# Patient Record
Sex: Female | Born: 1960 | Race: White | Hispanic: No | Marital: Married | Smoking: Never smoker
Health system: Southern US, Community
[De-identification: ages and names within clinical notes are randomized; demographics above are authoritative.]

## PROBLEM LIST (undated history)

## (undated) DIAGNOSIS — E785 Hyperlipidemia, unspecified: Secondary | ICD-10-CM

## (undated) DIAGNOSIS — Z9889 Other specified postprocedural states: Secondary | ICD-10-CM

## (undated) DIAGNOSIS — R112 Nausea with vomiting, unspecified: Secondary | ICD-10-CM

## (undated) HISTORY — PX: ABDOMINAL HYSTERECTOMY: SHX81

## (undated) HISTORY — DX: Hyperlipidemia, unspecified: E78.5

---

## 1998-08-16 ENCOUNTER — Other Ambulatory Visit: Admission: RE | Admit: 1998-08-16 | Discharge: 1998-08-16 | Payer: Self-pay | Admitting: Obstetrics & Gynecology

## 1999-10-10 ENCOUNTER — Encounter: Payer: Self-pay | Admitting: Obstetrics & Gynecology

## 1999-10-10 ENCOUNTER — Encounter: Admission: RE | Admit: 1999-10-10 | Discharge: 1999-10-10 | Payer: Self-pay | Admitting: Obstetrics & Gynecology

## 2000-06-30 ENCOUNTER — Other Ambulatory Visit: Admission: RE | Admit: 2000-06-30 | Discharge: 2000-06-30 | Payer: Self-pay | Admitting: Obstetrics & Gynecology

## 2000-07-13 ENCOUNTER — Ambulatory Visit (HOSPITAL_COMMUNITY): Admission: RE | Admit: 2000-07-13 | Discharge: 2000-07-13 | Payer: Self-pay | Admitting: Emergency Medicine

## 2001-12-21 ENCOUNTER — Other Ambulatory Visit: Admission: RE | Admit: 2001-12-21 | Discharge: 2001-12-21 | Payer: Self-pay | Admitting: Obstetrics & Gynecology

## 2002-03-15 ENCOUNTER — Other Ambulatory Visit: Admission: RE | Admit: 2002-03-15 | Discharge: 2002-03-15 | Payer: Self-pay | Admitting: Obstetrics & Gynecology

## 2002-09-22 ENCOUNTER — Other Ambulatory Visit: Admission: RE | Admit: 2002-09-22 | Discharge: 2002-09-22 | Payer: Self-pay | Admitting: Obstetrics & Gynecology

## 2003-07-11 ENCOUNTER — Other Ambulatory Visit: Admission: RE | Admit: 2003-07-11 | Discharge: 2003-07-11 | Payer: Self-pay | Admitting: Obstetrics and Gynecology

## 2003-07-21 ENCOUNTER — Ambulatory Visit (HOSPITAL_COMMUNITY): Admission: RE | Admit: 2003-07-21 | Discharge: 2003-07-21 | Payer: Self-pay | Admitting: Unknown Physician Specialty

## 2004-03-13 ENCOUNTER — Other Ambulatory Visit: Admission: RE | Admit: 2004-03-13 | Discharge: 2004-03-13 | Payer: Self-pay | Admitting: Obstetrics and Gynecology

## 2004-03-13 ENCOUNTER — Other Ambulatory Visit: Admission: RE | Admit: 2004-03-13 | Discharge: 2004-03-13 | Payer: Self-pay | Admitting: Specialist

## 2004-08-26 ENCOUNTER — Other Ambulatory Visit: Admission: RE | Admit: 2004-08-26 | Discharge: 2004-08-26 | Payer: Self-pay | Admitting: Obstetrics & Gynecology

## 2004-09-04 ENCOUNTER — Encounter: Admission: RE | Admit: 2004-09-04 | Discharge: 2004-09-04 | Payer: Self-pay | Admitting: Obstetrics & Gynecology

## 2005-12-02 ENCOUNTER — Encounter: Admission: RE | Admit: 2005-12-02 | Discharge: 2005-12-02 | Payer: Self-pay | Admitting: Obstetrics & Gynecology

## 2007-09-23 ENCOUNTER — Encounter: Admission: RE | Admit: 2007-09-23 | Discharge: 2007-09-23 | Payer: Self-pay | Admitting: Obstetrics & Gynecology

## 2008-10-06 ENCOUNTER — Ambulatory Visit (HOSPITAL_BASED_OUTPATIENT_CLINIC_OR_DEPARTMENT_OTHER): Admission: RE | Admit: 2008-10-06 | Discharge: 2008-10-06 | Payer: Self-pay | Admitting: Obstetrics & Gynecology

## 2008-10-06 ENCOUNTER — Ambulatory Visit: Payer: Self-pay | Admitting: Diagnostic Radiology

## 2008-12-05 ENCOUNTER — Ambulatory Visit (HOSPITAL_BASED_OUTPATIENT_CLINIC_OR_DEPARTMENT_OTHER): Admission: RE | Admit: 2008-12-05 | Discharge: 2008-12-05 | Payer: Self-pay | Admitting: General Surgery

## 2009-03-29 ENCOUNTER — Ambulatory Visit (HOSPITAL_BASED_OUTPATIENT_CLINIC_OR_DEPARTMENT_OTHER): Admission: RE | Admit: 2009-03-29 | Discharge: 2009-03-29 | Payer: Self-pay | Admitting: Family Medicine

## 2009-03-29 ENCOUNTER — Ambulatory Visit: Payer: Self-pay | Admitting: Diagnostic Radiology

## 2009-09-24 ENCOUNTER — Encounter (INDEPENDENT_AMBULATORY_CARE_PROVIDER_SITE_OTHER): Payer: Self-pay | Admitting: Obstetrics & Gynecology

## 2009-09-24 ENCOUNTER — Ambulatory Visit (HOSPITAL_COMMUNITY): Admission: RE | Admit: 2009-09-24 | Discharge: 2009-09-25 | Payer: Self-pay | Admitting: Obstetrics & Gynecology

## 2010-05-18 LAB — CBC
HCT: 40 % (ref 36.0–46.0)
MCH: 31.6 pg (ref 26.0–34.0)
MCHC: 34.3 g/dL (ref 30.0–36.0)
MCV: 92.3 fL (ref 78.0–100.0)
Platelets: 244 10*3/uL (ref 150–400)
Platelets: 279 10*3/uL (ref 150–400)
RBC: 4.33 MIL/uL (ref 3.87–5.11)
RDW: 12.9 % (ref 11.5–15.5)
RDW: 12.9 % (ref 11.5–15.5)
WBC: 7.4 10*3/uL (ref 4.0–10.5)

## 2010-05-18 LAB — SURGICAL PCR SCREEN: Staphylococcus aureus: NEGATIVE

## 2010-06-04 ENCOUNTER — Inpatient Hospital Stay (HOSPITAL_COMMUNITY)
Admission: AD | Admit: 2010-06-04 | Discharge: 2010-06-06 | DRG: 384 | Disposition: A | Discharge status: Home / Self Care | Payer: 59 | Source: Ambulatory Visit | Attending: Cardiovascular Disease | Admitting: Cardiovascular Disease

## 2010-06-04 DIAGNOSIS — K259 Gastric ulcer, unspecified as acute or chronic, without hemorrhage or perforation: Principal | ICD-10-CM | POA: Diagnosis present

## 2010-06-04 DIAGNOSIS — K5900 Constipation, unspecified: Secondary | ICD-10-CM | POA: Diagnosis present

## 2010-06-04 DIAGNOSIS — E559 Vitamin D deficiency, unspecified: Secondary | ICD-10-CM | POA: Diagnosis present

## 2010-06-04 DIAGNOSIS — E785 Hyperlipidemia, unspecified: Secondary | ICD-10-CM | POA: Diagnosis present

## 2010-06-04 DIAGNOSIS — Z79899 Other long term (current) drug therapy: Secondary | ICD-10-CM

## 2010-06-04 DIAGNOSIS — Z8249 Family history of ischemic heart disease and other diseases of the circulatory system: Secondary | ICD-10-CM

## 2010-06-04 LAB — BASIC METABOLIC PANEL
BUN: 13 mg/dL (ref 6–23)
Chloride: 106 mEq/L (ref 96–112)
Creatinine, Ser: 0.74 mg/dL (ref 0.4–1.2)
GFR calc Af Amer: >60 mL/min (ref >60)
Glucose, Bld: 93 mg/dL (ref 70–99)
Potassium: 3.8 mEq/L (ref 3.5–5.1)
Sodium: 138 mEq/L (ref 135–145)

## 2010-06-04 LAB — CARDIAC PANEL(CRET KIN+CKTOT+MB+TROPI): Troponin I: 0.01 ng/mL (ref 0.00–0.06)

## 2010-06-04 LAB — CBC
Hemoglobin: 12.7 g/dL (ref 12.0–15.0)
Platelets: 265 10*3/uL (ref 150–400)
RDW: 12.3 % (ref 11.5–15.5)

## 2010-06-04 LAB — MRSA PCR SCREENING: MRSA by PCR: NEGATIVE

## 2010-06-04 LAB — PROTIME-INR: INR: 0.96 (ref 0.00–1.49)

## 2010-06-05 LAB — POCT ACTIVATED CLOTTING TIME: Activated Clotting Time: 93 seconds

## 2010-06-05 LAB — HEPARIN LEVEL (UNFRACTIONATED): Heparin Unfractionated: 0.34 IU/mL (ref 0.30–0.70)

## 2010-06-06 ENCOUNTER — Other Ambulatory Visit: Payer: Self-pay | Admitting: Gastroenterology

## 2010-06-06 LAB — CBC
HCT: 40.5 % (ref 36.0–46.0)
Hemoglobin: 13.9 g/dL (ref 12.0–15.0)
MCH: 31.3 pg (ref 26.0–34.0)
MCV: 91.2 fL (ref 78.0–100.0)
RBC: 4.44 MIL/uL (ref 3.87–5.11)

## 2010-06-06 LAB — HEPATIC FUNCTION PANEL: ALT: 23 U/L (ref 0–35)

## 2010-06-06 LAB — HEPARIN LEVEL (UNFRACTIONATED): Heparin Unfractionated: <0.1 IU/mL — ABNORMAL LOW (ref 0.30–0.70)

## 2010-06-06 LAB — POCT HEMOGLOBIN-HEMACUE: Hemoglobin: 14 g/dL (ref 12.0–15.0)

## 2010-06-11 NOTE — Procedures (Signed)
  NAMETEMPRANCE, WYRE             ACCOUNT NO.:  [id number]  MEDICAL RECORD NO.:  [id number]           PATIENT TYPE:  I  LOCATION:  6531                         FACILITY:  [facility]  PHYSICIAN:  Nanetta Batty, M.D.   DATE OF BIRTH:  1961/02/06  DATE OF PROCEDURE:  06/05/2010 DATE OF DISCHARGE:                           CARDIAC CATHETERIZATION   Katie Ho is a 50 year old female with risk factors including hyperlipidemia and strong family history who was admitted from my office yesterday with compelling story for 10 days accelerated crescendo angina, which was nitro responsive.  It had awakened her from sleep. She was placed on IV heparin, nitro, and her pain subsided.  She ruled out for myocardial infarction.  There are no EKG changes.  She presents now for diagnostic coronary angiography to define her anatomy, rule out ischemic etiology.  PROCEDURE DESCRIPTION:  The patient was brought to the second floor of [facility] of cardiac cath lab in the postabsorptive state.  She was not premedicated.  Her right wrist and groin were prepped and shaved in usual sterile fashion.  Xylocaine 1% was used for local anesthesia. Attempts were made to access right radial artery unsuccessfully probably because of spasm, and her right femoral was accessed using standard Seldinger technique with 5-French catheter, 5-French sheath.  5-French right and left Judkins diagnostic catheter as well as 5-French pigtail catheter were used for selective cholangiography and left ventriculography respectively.  Visipaque dye was used for the entirety of the case.  Retrograde aortic, left ventricular blood pressures were recorded.  HEMODYNAMICS: 1. Aortic systolic pressure 111, diastolic pressure 661. 2. Left ventricular systolic pressure 109, end-diastolic pressure 10.  SELECTIVE CORONARY ANGIOGRAPHY: 1. Left main normal. 2. LAD normal. 3. Left circumflex was dominant normal. 4. Right coronary artery was  nondominant normal. 5. Left ventriculography; RAO left ventriculogram was performed using     25 mL of Visipaque dye at 12 mL per second.  The overall LVEF was     estimated greater than 65% without focal wall motion abnormalities.  IMPRESSION:  Katie Ho has essentially normal coronary arteries and normal left ventricular function with left dominant system.  I believe her chest pain is noncardiac.  Sheath was removed.  Pressures were held over the site until achieved hemostasis.  The patient left the lab in stable condition.  She will be treated empirically with antireflux medications.  Gastroenterology consult will be obtained.     Nanetta Batty, M.D.     [name]  D:  06/05/2010  T:  06/06/2010  Job:  [id number]  cc:   [facility] Katie Ho, M.D. Katie Ho, M.D.  Electronically Signed by Nanetta Batty M.D. on 06/11/2010 10:45:57 AM

## 2010-06-11 NOTE — H&P (Signed)
  Katie Ho, Katie Ho             ACCOUNT NO.:  [id number]  MEDICAL RECORD NO.:  [id number]           PATIENT TYPE:  I  LOCATION:  2928                         FACILITY:  [facility]  PHYSICIAN:  Nanetta Batty, M.D.   DATE OF BIRTH:  05/25/1960  DATE OF ADMISSION:  06/04/2010 DATE OF DISCHARGE:                             HISTORY & PHYSICAL   REFERRING PHYSICIAN:  Hilario Quarry, MD  Ms. Talkington is a very pleasant 50 year old fit-appearing married Caucasian female, mother of four children who works as an Nutritional therapist.  She was referred by Margarita Grizzle today from [facility] ER because of fairly new- onset chest pain.  Risk factors are positive for treated hypertriglyceridemia.  Her father had a massive MI at age 50 and was "a cardiac cripple" after that.  She does not smoke nor is she a diabetic.  There are no other significant medical problems.  She has never had a heart attack or stroke.  She has had chest pain for the last 10 days which has been effort to some extent but also rest angina.  It is also waking her from sleep.  She had chest pain today in my lobby.  MEDICATIONS:  Trilipix, ursodiol, vitamin D, calcium.  ALLERGIES:  None.  Twelve-point review of systems was negative except as already noted.  PHYSICAL EXAMINATION:  VITAL SIGNS:  Blood pressure was 126/70 with a pulse of 84.  Weight is 129. GENERAL:  The patient is alert and oriented.  Neurologically intact. LUNGS:  Clear.  Carotids are 2+ without bruits. HEART:  Regular rate and rhythm without murmurs, gallops, rubs, or clicks. ABDOMEN:  Soft and nontender with normoactive bowel sounds.  There is hepatosplenomegaly, organomegaly, or audible bruits.  She has intact pedal pulses.  Twelve-lead EKG reveals sinus rhythm at 80 without ST- or T-wave changes.  IMPRESSION:  [name] has accelerated angina with positive risk factors.  I am going to admit her to step down today and place her on IV heparin and nitro.  We  will arrange for her to undergo diagnostic coronary arteriography tomorrow via the right radial approach.  The patient is aware of the risks and benefits.  Daughter was present during the interview.     Nanetta Batty, M.D.     [name]  D:  06/04/2010  T:  06/05/2010  Job:  [id number]  cc:   Dr. Wylene Simmer  Electronically Signed by Nanetta Batty M.D. on 06/11/2010 10:45:55 AM

## 2010-06-11 NOTE — Discharge Summary (Signed)
  Katie Ho, Katie Ho             ACCOUNT NO.:  [id number]  MEDICAL RECORD NO.:  [id number]           PATIENT TYPE:  I  LOCATION:  6531                         FACILITY:  [facility]  PHYSICIAN:  Nanetta Batty, M.D.   DATE OF BIRTH:  04/07/1960  DATE OF ADMISSION:  06/04/2010 DATE OF DISCHARGE:  06/06/2010                              DISCHARGE SUMMARY   DISCHARGE DIAGNOSES: 1. Chest pain, worrisome for unstable angina, no significant coronary     artery disease at catheterization this admission. 2. Peptic ulcer disease with gastric ulcer by endoscopy this     admission. 3. Treated dyslipidemia.  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  She has no prior history of coronary artery disease.  Her father did have an MI at 60.  She has treated dyslipidemia.  She was admitted to the emergency room on June 04, 2010, after she presented with several days of chest pain, worrisome for unstable angina.  Please see admission history and physical for complete details.  The patient was admitted to telemetry and set up for diagnostic catheterization.  This was done via the right femoral artery without complications.  This showed normal coronaries and normal LV function.  Dr. Allyson Sabal asked Dr. Loreta Ave to see her in consult for possible GI etiology of her chest pain.  An endoscopy was done on the morning of May 26, 2010, which revealed 2 ulcers in the antrum.  Please see Dr. Kenna Gilbert operative note for complete details.  Plan is for PPI therapy. From a cardiac standpoint, we feel she can be discharged on June 06, 2010.  We will see her on a p.r.n. basis.  She will follow up with Dr. Loreta Ave.  We did write her prescription for Dexilant 60 mg daily.  She will continue her other home medications.  LABS:  White count 6.3, hemoglobin 13.9, hematocrit 40.5, platelets 267. Sodium 138, potassium 3.8, BUN 13, creatinine 0.74.  CK-MB and troponins were  negative.  Liver functions were normal.  INR was 0.96.  EKG shows sinus rhythm without acute changes.  DISPOSITION:  The patient was discharged in stable condition and will follow up with Dr. Allyson Sabal on a p.r.n. basis.  She will follow up Dr. Loreta Ave, we will contact her.    Abelino Derrick, P.A.   ______________________________ Nanetta Batty, M.D.   Lenard Lance  D:  06/06/2010  T:  06/07/2010  Job:  [id number]  cc:   Anselmo Rod, MD, [facility]  Electronically Signed by Corine Shelter P.A. on 06/10/2010 12:55:45 PM Electronically Signed by Nanetta Batty M.D. on 06/11/2010 10:45:59 AM

## 2010-06-20 ENCOUNTER — Institutional Professional Consult (permissible substitution): Payer: 59 | Admitting: Cardiovascular Disease

## 2011-04-29 ENCOUNTER — Other Ambulatory Visit (HOSPITAL_BASED_OUTPATIENT_CLINIC_OR_DEPARTMENT_OTHER): Payer: Self-pay | Admitting: Internal Medicine

## 2011-04-29 DIAGNOSIS — Z0000 Encounter for general adult medical examination without abnormal findings: Secondary | ICD-10-CM

## 2011-05-01 ENCOUNTER — Ambulatory Visit (HOSPITAL_BASED_OUTPATIENT_CLINIC_OR_DEPARTMENT_OTHER)
Admission: RE | Admit: 2011-05-01 | Discharge: 2011-05-01 | Disposition: A | Discharge status: Home / Self Care | Payer: 59 | Source: Ambulatory Visit | Attending: Internal Medicine | Admitting: Internal Medicine

## 2011-05-01 DIAGNOSIS — Z0000 Encounter for general adult medical examination without abnormal findings: Secondary | ICD-10-CM

## 2011-05-01 DIAGNOSIS — Z1231 Encounter for screening mammogram for malignant neoplasm of breast: Secondary | ICD-10-CM | POA: Insufficient documentation

## 2012-04-29 ENCOUNTER — Other Ambulatory Visit (HOSPITAL_BASED_OUTPATIENT_CLINIC_OR_DEPARTMENT_OTHER): Payer: Self-pay | Admitting: Obstetrics & Gynecology

## 2012-04-29 DIAGNOSIS — Z139 Encounter for screening, unspecified: Secondary | ICD-10-CM

## 2012-05-03 ENCOUNTER — Other Ambulatory Visit (HOSPITAL_BASED_OUTPATIENT_CLINIC_OR_DEPARTMENT_OTHER): Payer: Self-pay | Admitting: Internal Medicine

## 2012-05-03 ENCOUNTER — Ambulatory Visit (HOSPITAL_BASED_OUTPATIENT_CLINIC_OR_DEPARTMENT_OTHER)
Admission: RE | Admit: 2012-05-03 | Discharge: 2012-05-03 | Disposition: A | Discharge status: Home / Self Care | Payer: 59 | Source: Ambulatory Visit | Attending: Obstetrics & Gynecology | Admitting: Obstetrics & Gynecology

## 2012-05-03 DIAGNOSIS — Z1231 Encounter for screening mammogram for malignant neoplasm of breast: Secondary | ICD-10-CM | POA: Insufficient documentation

## 2012-05-03 DIAGNOSIS — Z139 Encounter for screening, unspecified: Secondary | ICD-10-CM

## 2012-06-22 ENCOUNTER — Encounter: Payer: Self-pay | Admitting: Internal Medicine

## 2012-06-22 ENCOUNTER — Ambulatory Visit (INDEPENDENT_AMBULATORY_CARE_PROVIDER_SITE_OTHER): Payer: 59 | Admitting: Internal Medicine

## 2012-06-22 ENCOUNTER — Telehealth: Payer: Self-pay | Admitting: Internal Medicine

## 2012-06-22 VITALS — BP 109/65 | HR 86 | Temp 97.6°F | Resp 18 | Ht 62.0 in | Wt 137.0 lb

## 2012-06-22 DIAGNOSIS — Z90710 Acquired absence of both cervix and uterus: Secondary | ICD-10-CM

## 2012-06-22 DIAGNOSIS — M8580 Other specified disorders of bone density and structure, unspecified site: Secondary | ICD-10-CM | POA: Insufficient documentation

## 2012-06-22 DIAGNOSIS — M899 Disorder of bone, unspecified: Secondary | ICD-10-CM

## 2012-06-22 DIAGNOSIS — L719 Rosacea, unspecified: Secondary | ICD-10-CM | POA: Insufficient documentation

## 2012-06-22 DIAGNOSIS — E781 Pure hyperglyceridemia: Secondary | ICD-10-CM

## 2012-06-22 DIAGNOSIS — Z803 Family history of malignant neoplasm of breast: Secondary | ICD-10-CM

## 2012-06-22 DIAGNOSIS — N393 Stress incontinence (female) (male): Secondary | ICD-10-CM | POA: Insufficient documentation

## 2012-06-22 NOTE — Telephone Encounter (Signed)
Pt scheduled with Dr Edward Jolly on Wednesday Jul 14, 2012 at 10:00 am.  Pt made aware of appt by person in the office. KH

## 2012-06-22 NOTE — Progress Notes (Signed)
Subjective:    Patient ID: Katie Ho, female    DOB: 31-Jan-1961, 52 y.o.   MRN: [id number]  HPI Katie Ho is a new pt here to establish primary care.  She is an Engineer, building services at [employer].    PMH of hypertriglyceridemia, osteopenia, rosacea and she is S/P hysterectomy for benign reasons.  She does report she has a history of a remote abnormal pap around 3-4 years ago (Dr. Jennette Kettle) .   She has a family history of breast cancer in her paternal grandmother and her maternal aunt.  Duane reports she has worsening urinary incontinence that happens when she exercises and currently even when she stands up from sitting position.  She is G4P2AB2.  She has a child die from  Cystic fibrosis at age 95.    She denies dysuria or urgency symtpoms.  She has been on estradiol 2 mg since her hysterectomy.  She also has 2 female relatives with Breast cancer and would like to have BRCA testing  No Known Allergies Past Medical History  Diagnosis Date  . Hyperlipidemia    Past Surgical History  Procedure Laterality Date  . Abdominal hysterectomy    . Cesarean section     History   Social History  . Marital Status: Married    Spouse Name: N/A    Number of Children: N/A  . Years of Education: N/A   Occupational History  . Not on file.   Social History Main Topics  . Smoking status: Not on file  . Smokeless tobacco: Never Used  . Alcohol Use: No  . Drug Use: No  . Sexually Active: Yes    Birth Control/ Protection: Surgical   Other Topics Concern  . Not on file   Social History Narrative  . No narrative on file   Family History  Problem Relation Age of Onset  . Heart disease Father   . Cancer Maternal Aunt 40    breast  . Mental illness Maternal Grandmother   . Cancer Maternal Grandfather     lung  . Cancer Paternal Grandmother 57    breast   Patient Active Problem List  Diagnosis  . Hypertriglyceridemia  . S/P hysterectomy  . Rosacea  . Osteopenia   No current outpatient prescriptions on  file prior to visit.   No current facility-administered medications on file prior to visit.       Review of Systems    see HPI Objective:   Physical Exam Physical Exam  Nursing note and vitals reviewed.  Constitutional: She is oriented to person, place, and time. She appears well-developed and well-nourished.  HENT:  Head: Normocephalic and atraumatic.  Cardiovascular: Normal rate and regular rhythm. Exam reveals no gallop and no friction rub.  No murmur heard.  Pulmonary/Chest: Breath sounds normal. She has no wheezes. She has no rales.  Neurological: She is alert and oriented to person, place, and time.  Skin: Skin is warm and dry.   Small red splotches on L side of cheek Psychiatric: She has a normal mood and affect. Her behavior is normal.         Assessment & Plan:  Stress incontinence  Will refer to Dr. Edward Jolly for further evaluation  Hypertriglyceridemia  On Fenofibrate  Will check labs  Family history of breast cancer.  Will refer for genetic counseling at [facility]  Rosacea   She wishes no meds now  Osteopenia  Will need old records from Dr. Jennette Kettle  ??? abnromal pap  Will have  pt sign for GYN records.

## 2012-06-23 ENCOUNTER — Other Ambulatory Visit: Payer: Self-pay | Admitting: Internal Medicine

## 2012-06-23 ENCOUNTER — Telehealth: Payer: Self-pay | Admitting: *Deleted

## 2012-06-23 DIAGNOSIS — Z803 Family history of malignant neoplasm of breast: Secondary | ICD-10-CM | POA: Insufficient documentation

## 2012-06-23 LAB — COMPREHENSIVE METABOLIC PANEL
Albumin: 4.2 g/dL (ref 3.5–5.2)
Alkaline Phosphatase: 41 U/L (ref 39–117)
BUN: 14 mg/dL (ref 6–23)
CO2: 27 mEq/L (ref 19–32)
Calcium: 9.6 mg/dL (ref 8.4–10.5)
Chloride: 105 mEq/L (ref 96–112)
Glucose, Bld: 81 mg/dL (ref 70–99)
Potassium: 4.5 mEq/L (ref 3.5–5.3)
Sodium: 139 mEq/L (ref 135–145)
Total Protein: 7 g/dL (ref 6.0–8.3)

## 2012-06-23 LAB — CBC WITH DIFFERENTIAL/PLATELET
HCT: 40.7 % (ref 36.0–46.0)
Hemoglobin: 13.6 g/dL (ref 12.0–15.0)
Lymphocytes Relative: 35 % (ref 12–46)
Lymphs Abs: 2.1 10*3/uL (ref 0.7–4.0)
MCHC: 33.4 g/dL (ref 30.0–36.0)
Monocytes Absolute: 0.4 10*3/uL (ref 0.1–1.0)
Monocytes Relative: 6 % (ref 3–12)
Neutro Abs: 3.4 10*3/uL (ref 1.7–7.7)
Neutrophils Relative %: 56 % (ref 43–77)
RBC: 4.49 MIL/uL (ref 3.87–5.11)
WBC: 6 10*3/uL (ref 4.0–10.5)

## 2012-06-23 LAB — LIPID PANEL
HDL: 65 mg/dL (ref >39)
LDL Cholesterol: 114 mg/dL — ABNORMAL HIGH (ref 0–99)
Triglycerides: 162 mg/dL — ABNORMAL HIGH (ref <150)

## 2012-06-23 MED ORDER — ERGOCALCIFEROL 1.25 MG (50000 UT) PO CAPS: 50000.0000 [IU] | ORAL_CAPSULE | ORAL | Status: DC

## 2012-06-23 NOTE — Patient Instructions (Signed)
Will refer to GYN:  Dr. Edward Jolly  Will refer to Genetic counselor

## 2012-06-23 NOTE — Telephone Encounter (Signed)
Pt returned call regarding her Vit D also pt would like to call and make her own appt regarding genetic counseling and BRACA testing

## 2012-06-23 NOTE — Telephone Encounter (Signed)
Message copied by Mathews Robinsons on Wed Jun 23, 2012  2:38 PM ------      Message from: Raechel Chute D      Created: Wed Jun 23, 2012 12:28 PM       Karen Kitchens              Call [place] and let her know that her vitamin D is very low.  I am going to prescribe Vitamin D3 50,000 units to be taken once a week for 8 weeks then she can take 2000 units OTC.  Give her an office visit in 3 months and I will recheck her vitaimin D            OK to mail labs to her   I placed on your desk ------

## 2012-06-23 NOTE — Telephone Encounter (Signed)
LVM regarding VIT D

## 2012-07-14 ENCOUNTER — Encounter: Payer: Self-pay | Admitting: Obstetrics and Gynecology

## 2012-10-04 ENCOUNTER — Telehealth: Payer: Self-pay | Admitting: *Deleted

## 2012-10-04 ENCOUNTER — Other Ambulatory Visit: Payer: Self-pay | Admitting: Internal Medicine

## 2012-10-04 MED ORDER — FENOFIBRATE 160 MG PO TABS: 160.0000 mg | ORAL_TABLET | Freq: Every day | ORAL | Status: DC

## 2012-10-04 NOTE — Telephone Encounter (Signed)
Katie Ho needs fenofibrate 160 MG tablet refilled. She gets it from Pharmacy downstairs.

## 2013-03-10 ENCOUNTER — Ambulatory Visit (INDEPENDENT_AMBULATORY_CARE_PROVIDER_SITE_OTHER): Payer: 59 | Admitting: Internal Medicine

## 2013-03-10 ENCOUNTER — Encounter: Payer: Self-pay | Admitting: Internal Medicine

## 2013-03-10 VITALS — BP 120/66 | HR 74 | Temp 98.2°F | Resp 18 | Ht 62.0 in | Wt 142.0 lb

## 2013-03-10 DIAGNOSIS — Z1272 Encounter for screening for malignant neoplasm of vagina: Secondary | ICD-10-CM

## 2013-03-10 DIAGNOSIS — Z1151 Encounter for screening for human papillomavirus (HPV): Secondary | ICD-10-CM

## 2013-03-10 DIAGNOSIS — Z90710 Acquired absence of both cervix and uterus: Secondary | ICD-10-CM

## 2013-03-10 DIAGNOSIS — IMO0002 Reserved for concepts with insufficient information to code with codable children: Secondary | ICD-10-CM

## 2013-03-10 DIAGNOSIS — N393 Stress incontinence (female) (male): Secondary | ICD-10-CM

## 2013-03-10 DIAGNOSIS — M899 Disorder of bone, unspecified: Secondary | ICD-10-CM

## 2013-03-10 DIAGNOSIS — N8111 Cystocele, midline: Secondary | ICD-10-CM

## 2013-03-10 DIAGNOSIS — E559 Vitamin D deficiency, unspecified: Secondary | ICD-10-CM

## 2013-03-10 DIAGNOSIS — Z9079 Acquired absence of other genital organ(s): Secondary | ICD-10-CM

## 2013-03-10 DIAGNOSIS — M8580 Other specified disorders of bone density and structure, unspecified site: Secondary | ICD-10-CM

## 2013-03-10 DIAGNOSIS — M949 Disorder of cartilage, unspecified: Secondary | ICD-10-CM

## 2013-03-10 DIAGNOSIS — Z0000 Encounter for general adult medical examination without abnormal findings: Secondary | ICD-10-CM

## 2013-03-10 DIAGNOSIS — E781 Pure hyperglyceridemia: Secondary | ICD-10-CM

## 2013-03-10 DIAGNOSIS — R6889 Other general symptoms and signs: Secondary | ICD-10-CM

## 2013-03-10 DIAGNOSIS — Z90722 Acquired absence of ovaries, bilateral: Secondary | ICD-10-CM

## 2013-03-10 LAB — POCT URINALYSIS DIPSTICK
BILIRUBIN UA: NEGATIVE
Glucose, UA: NEGATIVE
Ketones, UA: NEGATIVE
Leukocytes, UA: NEGATIVE
NITRITE UA: NEGATIVE
PH UA: 6
Protein, UA: NEGATIVE
RBC UA: NEGATIVE
Spec Grav, UA: 1.015
Urobilinogen, UA: NEGATIVE

## 2013-03-10 LAB — HEMOCCULT GUIAC POC 1CARD (OFFICE): FECAL OCCULT BLD: NEGATIVE

## 2013-03-10 NOTE — Patient Instructions (Signed)
[facility]

## 2013-03-12 ENCOUNTER — Encounter: Payer: Self-pay | Admitting: Internal Medicine

## 2013-03-12 DIAGNOSIS — Z9079 Acquired absence of other genital organ(s): Secondary | ICD-10-CM

## 2013-03-12 DIAGNOSIS — IMO0002 Reserved for concepts with insufficient information to code with codable children: Secondary | ICD-10-CM | POA: Insufficient documentation

## 2013-03-12 DIAGNOSIS — Z90722 Acquired absence of ovaries, bilateral: Secondary | ICD-10-CM

## 2013-03-12 DIAGNOSIS — E559 Vitamin D deficiency, unspecified: Secondary | ICD-10-CM | POA: Insufficient documentation

## 2013-03-12 DIAGNOSIS — Z90710 Acquired absence of both cervix and uterus: Secondary | ICD-10-CM | POA: Insufficient documentation

## 2013-03-12 NOTE — Progress Notes (Signed)
Subjective:    Patient ID: Katie MansonSharon S Ho, female    DOB: 08/07/1960, 53 y.o.   MRN: [id number]  HPI  Katie Ho is here for CPE.   Cystocele/stress incontinence  She reports she did not see Dr. Edward JollySilva as she is not interested in a pessary and symptoms are not severe enough for surgical intervention  She does have some vaginal dryness - Katie Ho has been on estradiol since her Hyst/BSO in 2011.  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   Her GYN Dr. Jennette KettleNeal informed her she would not need pap smears after hysterectomy.  Katie Ho was unable to tolerate her high dose vitamin D as it makes her extremely constipated.  Her mother had osteoporosis and pt had her last DEXA in 2012 and showed osteopenia.  She cannot tolerate vitamin D or calcium well  No Known Allergies Past Medical History  Diagnosis Date  . Hyperlipidemia    Past Surgical History  Procedure Laterality Date  . Cesarean section    . Abdominal hysterectomy     History   Social History  . Marital Status: Married    Spouse Name: N/A    Number of Children: N/A  . Years of Education: N/A   Occupational History  . Not on file.   Social History Main Topics  . Smoking status: Never Smoker   . Smokeless tobacco: Never Used  . Alcohol Use: No  . Drug Use: No  . Sexual Activity: Yes    Birth Control/ Protection: Surgical   Other Topics Concern  . Not on file   Social History Narrative  . No narrative on file   Family History  Problem Relation Age of Onset  . Heart disease Father   . Cancer Maternal Aunt 40    breast  . Mental illness Maternal Grandmother   . Cancer Maternal Grandfather     lung  . Cancer Paternal Grandmother [phone]    breast   Patient Active Problem List   Diagnosis Date Noted  . Cystocele 03/12/2013  . S/P total hysterectomy and BSO (bilateral salpingo-oophorectomy) 03/12/2013  . Vitamin D deficiency 03/12/2013  . 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 Family history of breast cancer 06/23/2012  . Hypertriglyceridemia 06/22/2012  . Rosacea 06/22/2012  . Osteopenia 06/22/2012  . Stress incontinence 06/22/2012   Current Outpatient Prescriptions on File Prior to Visit  Medication Sig Dispense Refill  . estradiol (ESTRACE) 2 MG tablet Take 2 mg by mouth daily.      . fenofibrate 160 MG tablet Take 1 tablet (160 mg total) by mouth daily.  90 tablet  1  . ergocalciferol (VITAMIN D2) 50000 UNITS capsule Take 1 capsule (50,000 Units total) by mouth once a week.  8 capsule  0   No current facility-administered medications on file prior to visit.      Review of Systems  Respiratory: Negative for cough and chest tightness.   Cardiovascular: Negative for chest pain.  Gastrointestinal: Positive for constipation.  Genitourinary: Positive for urgency. Negative for pelvic pain.  All other systems reviewed and are negative.       Objective:   Physical Exam Physical Exam  Vital signs and nursing note reviewed  Constitutional: She is oriented to person, place, and time. She appears well-developed and well-nourished. She is cooperative.  HENT:  Head: Normocephalic and atraumatic.  Right Ear: Tympanic membrane normal.  Left Ear: Tympanic membrane normal.  Nose: Nose normal.  Mouth/Throat:  Oropharynx is clear and moist and mucous membranes are normal. No oropharyngeal exudate or posterior oropharyngeal erythema.  Eyes: Conjunctivae and EOM are normal. Pupils are equal, round, and reactive to light.  Neck: Neck supple. No JVD present. Carotid bruit is not present. No mass and no thyromegaly present.  Cardiovascular: Regular rhythm, normal heart sounds, intact distal pulses and normal pulses.  Exam reveals no gallop and no friction rub.   No murmur heard. Pulses:      Dorsalis pedis pulses are 2+ on the right side, and 2+ on the left side.  Pulmonary/Chest: Breath sounds normal. She has no wheezes. She has no rhonchi. She has no rales.  Right breast exhibits no mass, no nipple discharge and no skin change. Left breast exhibits no mass, no nipple discharge and no skin change.  Abdominal: Soft. Bowel sounds are normal. She exhibits no distension and no mass. There is no hepatosplenomegaly. There is no tenderness. There is no CVA tenderness.  Genitourinary: Rectum normal, vagina normal and uterus normal. Rectal exam shows no mass. Guaiac negative stool. No labial fusion. There is no lesion on the right labia. There is no lesion on the left labia. Cervix exhibits no motion tenderness. Right adnexum displays no mass, no tenderness and no fullness. Left adnexum displays no mass, no tenderness and no fullness. No erythema around the vagina.  Cystocele present  Musculoskeletal:       No active synovitis to any joint.    Lymphadenopathy:       Right cervical: No superficial cervical adenopathy present.      Left cervical: No superficial cervical adenopathy present.       Right axillary: No pectoral and no lateral adenopathy present.       Left axillary: No pectoral and no lateral adenopathy present.      Right: No inguinal adenopathy present.       Left: No inguinal adenopathy present.  Neurological: She is alert and oriented to person, place, and time. She has normal strength and normal reflexes. No cranial nerve deficit or sensory deficit. She displays a negative Romberg sign. Coordination and gait normal.  Skin: Skin is warm and dry. No abrasion, no bruising, no ecchymosis and no rash noted. No cyanosis. Nails show no clubbing.  Psychiatric: She has a normal mood and affect. Her speech is normal and behavior is normal.          Assessment & Plan:  Health Maintenance  Will get one vaginal smear today  If negative will not need in the future.  UTD with mm,  Colonosocopy, immunizations  Hypertriglyceridemia  Continue fenofibrate  Stress incontinence/cystocele  Vitamin D deficiency/ FH osteoporosis:  Pt is at high risk as she  cannot tolerate replacement .  Will get Dexa  Constipation  OK for daily Colace and advised to take bid Miralax when severe symptoms and can taper to qod Miralax if needed  Rosacea  Vaginal dryness  On oral estradiol.  Luvena samples given  See me as needed          Assessment & Plan:

## 2013-03-14 ENCOUNTER — Other Ambulatory Visit (HOSPITAL_COMMUNITY): Payer: 59

## 2013-03-23 ENCOUNTER — Encounter: Payer: Self-pay | Admitting: *Deleted

## 2013-03-23 ENCOUNTER — Encounter: Payer: Self-pay | Admitting: Internal Medicine

## 2013-04-01 ENCOUNTER — Other Ambulatory Visit: Payer: Self-pay | Admitting: *Deleted

## 2013-04-01 DIAGNOSIS — Z139 Encounter for screening, unspecified: Secondary | ICD-10-CM

## 2013-04-01 NOTE — Telephone Encounter (Signed)
Refill request

## 2013-04-03 MED ORDER — ESTRADIOL 2 MG PO TABS: 2.0000 mg | ORAL_TABLET | Freq: Every day | ORAL | Status: DC

## 2013-04-03 NOTE — Telephone Encounter (Signed)
Katie KitchensBobbie   [name] know that I refilled 30 days of her estradiol as she is due for her mm in early March.  Once I get mm result I will order more estradiol

## 2013-04-07 NOTE — Telephone Encounter (Signed)
Notified pt of MM and RX 3D MM ordered in epic

## 2013-05-11 ENCOUNTER — Ambulatory Visit
Admission: RE | Admit: 2013-05-11 | Discharge: 2013-05-11 | Disposition: A | Discharge status: Home / Self Care | Payer: 59 | Source: Ambulatory Visit | Attending: Internal Medicine | Admitting: Internal Medicine

## 2013-05-11 DIAGNOSIS — Z139 Encounter for screening, unspecified: Secondary | ICD-10-CM

## 2013-05-12 ENCOUNTER — Other Ambulatory Visit: Payer: Self-pay | Admitting: Internal Medicine

## 2013-05-12 NOTE — Telephone Encounter (Signed)
Refill request

## 2013-07-26 ENCOUNTER — Other Ambulatory Visit: Payer: Self-pay | Admitting: Internal Medicine

## 2013-07-26 NOTE — Telephone Encounter (Signed)
Refill request

## 2013-11-15 ENCOUNTER — Other Ambulatory Visit: Payer: Self-pay | Admitting: Internal Medicine

## 2013-11-15 NOTE — Telephone Encounter (Signed)
Refill request

## 2014-01-02 ENCOUNTER — Encounter: Payer: Self-pay | Admitting: Internal Medicine

## 2014-02-16 ENCOUNTER — Other Ambulatory Visit: Payer: Self-pay | Admitting: Internal Medicine

## 2014-02-16 ENCOUNTER — Other Ambulatory Visit: Payer: Self-pay

## 2014-02-16 DIAGNOSIS — Z1231 Encounter for screening mammogram for malignant neoplasm of breast: Secondary | ICD-10-CM

## 2014-02-16 NOTE — Telephone Encounter (Signed)
Refill request

## 2014-02-17 NOTE — Telephone Encounter (Signed)
No labs done since 06/2012   Refilled for 30 days only    She has CPE in March  She can come get complete panel of labs within 30 days and I can refill more or she can see me in office in 30 days pt choice

## 2014-02-21 ENCOUNTER — Other Ambulatory Visit: Payer: Self-pay | Admitting: *Deleted

## 2014-02-21 DIAGNOSIS — Z0000 Encounter for general adult medical examination without abnormal findings: Secondary | ICD-10-CM

## 2014-02-21 NOTE — Telephone Encounter (Signed)
I spoke with Jasmine DecemberSharon and she is coming in for lab at the first of the year

## 2014-03-07 LAB — CBC WITH DIFFERENTIAL/PLATELET
Basophils Absolute: 0 10*3/uL (ref 0.0–0.1)
Basophils Relative: 0 % (ref 0–1)
EOS ABS: 0.1 10*3/uL (ref 0.0–0.7)
Eosinophils Relative: 2 % (ref 0–5)
HCT: 39.9 % (ref 36.0–46.0)
Hemoglobin: 13.7 g/dL (ref 12.0–15.0)
Lymphocytes Relative: 41 % (ref 12–46)
Lymphs Abs: 2.2 10*3/uL (ref 0.7–4.0)
MCH: 30.7 pg (ref 26.0–34.0)
MCHC: 34.3 g/dL (ref 30.0–36.0)
MCV: 89.5 fL (ref 78.0–100.0)
MONOS PCT: 6 % (ref 3–12)
MPV: 9.6 fL (ref 9.4–12.4)
Monocytes Absolute: 0.3 10*3/uL (ref 0.1–1.0)
NEUTROS ABS: 2.7 10*3/uL (ref 1.7–7.7)
Neutrophils Relative %: 51 % (ref 43–77)
PLATELETS: 316 10*3/uL (ref 150–400)
RBC: 4.46 MIL/uL (ref 3.87–5.11)
RDW: 13.3 % (ref 11.5–15.5)
WBC: 5.3 10*3/uL (ref 4.0–10.5)

## 2014-03-07 LAB — LIPID PANEL
CHOL/HDL RATIO: 3.4 ratio
Cholesterol: 222 mg/dL — ABNORMAL HIGH (ref 0–200)
HDL: 66 mg/dL (ref >39)
LDL CALC: 126 mg/dL — AB (ref 0–99)
Triglycerides: 150 mg/dL — ABNORMAL HIGH (ref <150)
VLDL: 30 mg/dL (ref 0–40)

## 2014-03-07 LAB — COMPLETE METABOLIC PANEL WITH GFR
ALT: 19 U/L (ref 0–35)
AST: 22 U/L (ref 0–37)
Albumin: 3.9 g/dL (ref 3.5–5.2)
Alkaline Phosphatase: 33 U/L — ABNORMAL LOW (ref 39–117)
BUN: 13 mg/dL (ref 6–23)
CO2: 25 mEq/L (ref 19–32)
Calcium: 9.1 mg/dL (ref 8.4–10.5)
Chloride: 105 mEq/L (ref 96–112)
Creat: 0.72 mg/dL (ref 0.50–1.10)
GFR, Est African American: >89 mL/min
GFR, Est Non African American: >89 mL/min
GLUCOSE: 90 mg/dL (ref 70–99)
Potassium: 4.3 mEq/L (ref 3.5–5.3)
Sodium: 139 mEq/L (ref 135–145)
Total Bilirubin: 0.5 mg/dL (ref 0.2–1.2)
Total Protein: 6.5 g/dL (ref 6.0–8.3)

## 2014-03-07 LAB — TSH: TSH: 1.913 u[IU]/mL (ref 0.350–4.500)

## 2014-03-08 LAB — VITAMIN D 25 HYDROXY (VIT D DEFICIENCY, FRACTURES): VIT D 25 HYDROXY: 13 ng/mL — AB (ref 30–100)

## 2014-03-09 ENCOUNTER — Telehealth: Payer: Self-pay | Admitting: Internal Medicine

## 2014-03-09 MED ORDER — VITAMIN D (ERGOCALCIFEROL) 1.25 MG (50000 UNIT) PO CAPS: 50000.0000 [IU] | ORAL_CAPSULE | ORAL | Status: DC

## 2014-03-09 NOTE — Telephone Encounter (Signed)
Let Lamari know her vitamin D is very low  I e-scribed 50,000 units once a week for 12 week then [phone] units OTC daily    Ok to mail labs to her

## 2014-03-09 NOTE — Telephone Encounter (Signed)
I spoke with Katie Ho about her labs results- eh

## 2014-03-27 ENCOUNTER — Other Ambulatory Visit: Payer: Self-pay | Admitting: Internal Medicine

## 2014-03-27 NOTE — Telephone Encounter (Signed)
Refill request

## 2014-04-27 ENCOUNTER — Other Ambulatory Visit: Payer: Self-pay | Admitting: Internal Medicine

## 2014-04-27 NOTE — Telephone Encounter (Signed)
Refill request

## 2014-05-10 ENCOUNTER — Encounter: Payer: Self-pay | Admitting: Internal Medicine

## 2014-05-10 ENCOUNTER — Ambulatory Visit (INDEPENDENT_AMBULATORY_CARE_PROVIDER_SITE_OTHER): Payer: 59 | Admitting: Internal Medicine

## 2014-05-10 VITALS — BP 115/60 | HR 93 | Resp 16 | Ht 61.0 in | Wt 138.0 lb

## 2014-05-10 DIAGNOSIS — Z0000 Encounter for general adult medical examination without abnormal findings: Secondary | ICD-10-CM

## 2014-05-10 DIAGNOSIS — Z1211 Encounter for screening for malignant neoplasm of colon: Secondary | ICD-10-CM

## 2014-05-10 DIAGNOSIS — E559 Vitamin D deficiency, unspecified: Secondary | ICD-10-CM

## 2014-05-10 DIAGNOSIS — E781 Pure hyperglyceridemia: Secondary | ICD-10-CM

## 2014-05-10 DIAGNOSIS — Z0189 Encounter for other specified special examinations: Secondary | ICD-10-CM

## 2014-05-10 DIAGNOSIS — M8580 Other specified disorders of bone density and structure, unspecified site: Secondary | ICD-10-CM

## 2014-05-10 DIAGNOSIS — L719 Rosacea, unspecified: Secondary | ICD-10-CM

## 2014-05-10 LAB — HEMOCCULT GUIAC POC 1CARD (OFFICE)
FECAL OCCULT BLD: NEGATIVE
OCCULT BLOOD DATE: 3082016

## 2014-05-10 LAB — POCT URINALYSIS DIPSTICK
BILIRUBIN UA: NEGATIVE
Blood, UA: NEGATIVE
Glucose, UA: NEGATIVE
Ketones, UA: NEGATIVE
Leukocytes, UA: NEGATIVE
Nitrite, UA: NEGATIVE
PH UA: 6.5
PROTEIN UA: NEGATIVE
SPEC GRAV UA: 1.01
Urobilinogen, UA: NEGATIVE

## 2014-05-10 NOTE — Progress Notes (Signed)
Subjective:    Patient ID: Katie Ho, female    DOB: 1960-06-20, 54 y.o.   MRN: [id number]  HPI 03/10/2013 note Assessment & Plan:  Health Maintenance Will get one vaginal smear today If negative will not need in the future. UTD with mm, Colonosocopy, immunizations  Hypertriglyceridemia Continue fenofibrate  Stress incontinence/cystocele  Vitamin D deficiency/ FH osteoporosis: Pt is at high risk as she cannot tolerate replacement . Will get Dexa  Constipation OK for daily Colace and advised to take bid Miralax when severe symptoms and can taper to qod Miralax if needed  Rosacea  Vaginal dryness On oral estradiol. Luvena samples given  See me as needed          TODAY  Katie Ho is here for CPE   HM    No Known Allergies Past Medical History  Diagnosis Date  . Hyperlipidemia    Past Surgical History  Procedure Laterality Date  . Cesarean section    . Abdominal hysterectomy     History   Social History  . Marital Status: Married    Spouse Name: N/A  . Number of Children: N/A  . Years of Education: N/A   Occupational History  . Not on file.   Social History Main Topics  . Smoking status: Never Smoker   . Smokeless tobacco: Never Used  . Alcohol Use: No  . Drug Use: No  . Sexual Activity: Yes    Birth Control/ Protection: Surgical   Other Topics Concern  . Not on file   Social History Narrative   Family History  Problem Relation Age of Onset  . Heart disease Father   . Cancer Maternal Aunt 40    breast  . Mental illness Maternal Grandmother   . Cancer Maternal Grandfather     lung  . Cancer Paternal Grandmother 51    breast   Patient Active Problem List   Diagnosis Date Noted  . Cystocele 03/12/2013  . S/P total hysterectomy and BSO (bilateral salpingo-oophorectomy) 03/12/2013  . Vitamin D deficiency 03/12/2013  . LGSIL (low grade squamous intraepithelial dysplasia) 03/10/2013  . Family history of breast cancer  06/23/2012  . Hypertriglyceridemia 06/22/2012  . Rosacea 06/22/2012  . Osteopenia 06/22/2012  . Stress incontinence 06/22/2012   Current Outpatient Prescriptions on File Prior to Visit  Medication Sig Dispense Refill  . docusate sodium (COLACE) 100 MG capsule Take 100 mg by mouth 2 (two) times daily. Pt takes 2 cap in the am and 2 in the PM    . estradiol (ESTRACE) 2 MG tablet TAKE 1 TABLET BY MOUTH DAILY 30 tablet 2  . fenofibrate 160 MG tablet TAKE 1 TABLET BY MOUTH ONCE DAILY 30 tablet 3  . Vitamin D, Ergocalciferol, (DRISDOL) 50000 UNITS CAPS capsule Take 1 capsule (50,000 Units total) by mouth every 7 (seven) days. 12 capsule 0   No current facility-administered medications on file prior to visit.       Review of Systems See HPI    Objective:   Physical Exam Physical Exam  Nursing note and vitals reviewed.  Constitutional: She is oriented to person, place, and time. She appears well-developed and well-nourished.  HENT:  Head: Normocephalic and atraumatic.  Right Ear: Tympanic membrane and ear canal normal. No drainage. Tympanic membrane is not injected and not erythematous.  Left Ear: Tympanic membrane and ear canal normal. No drainage. Tympanic membrane is not injected and not erythematous.  Nose: Nose normal. Right sinus exhibits no maxillary sinus tenderness and  no frontal sinus tenderness. Left sinus exhibits no maxillary sinus tenderness and no frontal sinus tenderness.  Mouth/Throat: Oropharynx is clear and moist. No oral lesions. No oropharyngeal exudate.  Eyes: Conjunctivae and EOM are normal. Pupils are equal, round, and reactive to light.  Neck: Normal range of motion. Neck supple. No JVD present. Carotid bruit is not present. No mass and no thyromegaly present.  Cardiovascular: Normal rate, regular rhythm, S1 normal, S2 normal and intact distal pulses. Exam reveals no gallop and no friction rub.  No murmur heard.  Pulses:  Carotid pulses are 2+ on the right side,  and 2+ on the left side.  Dorsalis pedis pulses are 2+ on the right side, and 2+ on the left side.  No carotid bruit. No LE edema  Pulmonary/Chest: Breath sounds normal. She has no wheezes. She has no rales. She exhibits no tenderness.  Breast no discrete mass no nipple discharge no axillary adenopathy bilaterally Abdominal: Soft. Bowel sounds are normal. She exhibits no distension and no mass. There is no hepatosplenomegaly. There is no tenderness. There is no CVA tenderness.  Musculoskeletal: Normal range of motion.  No active synovitis to joints.  Lymphadenopathy:  She has no cervical adenopathy.  She has no axillary adenopathy.  Right: No inguinal and no supraclavicular adenopathy present.  Left: No inguinal and no supraclavicular adenopathy present.  Neurological: She is alert and oriented to person, place, and time. She has normal strength and normal reflexes. She displays no tremor. No cranial nerve deficit or sensory deficit. Coordination and gait normal.  Skin: Skin is warm and dry. No rash noted. No cyanosis. Nails show no clubbing.  Psychiatric: She has a normal mood and affect. Her speech is normal and behavior is normal. Cognition and memory are normal.          Assessment & Plan:  HM  Pt has mm scheduled  See scanned HM sheet   Hypertriglyceridemia  Improved on [place]  Osteopenia  Calcium and vitamin D  Vitamin D deficiency she is taking 50K weekly  Will recheck when completed  FH breast CA  Advised to get yearly mm  She has been on estradiol for past 4-5 years  Since her  hyst

## 2014-05-17 ENCOUNTER — Ambulatory Visit: Payer: 59

## 2014-05-31 ENCOUNTER — Ambulatory Visit
Admission: RE | Admit: 2014-05-31 | Discharge: 2014-05-31 | Disposition: A | Discharge status: Home / Self Care | Payer: 59 | Source: Ambulatory Visit

## 2014-05-31 DIAGNOSIS — Z1231 Encounter for screening mammogram for malignant neoplasm of breast: Secondary | ICD-10-CM

## 2014-06-06 ENCOUNTER — Other Ambulatory Visit: Payer: Self-pay | Admitting: *Deleted

## 2014-06-06 MED ORDER — ESTRADIOL 2 MG PO TABS: 2.0000 mg | ORAL_TABLET | Freq: Every day | ORAL | Status: DC

## 2015-03-16 MED FILL — FENOFIBRATE 160 MG TABLET: 160 | 90 days supply | Qty: 90 | Fill #0

## 2015-04-05 ENCOUNTER — Ambulatory Visit (INDEPENDENT_AMBULATORY_CARE_PROVIDER_SITE_OTHER): Payer: 59

## 2015-04-05 ENCOUNTER — Ambulatory Visit (INDEPENDENT_AMBULATORY_CARE_PROVIDER_SITE_OTHER): Payer: 59 | Admitting: Sports Medicine

## 2015-04-05 VITALS — BP 118/73 | HR 82 | Resp 16 | Wt 138.0 lb

## 2015-04-05 DIAGNOSIS — M25511 Pain in right shoulder: Secondary | ICD-10-CM

## 2015-04-05 MED ORDER — TRAMADOL HCL 50 MG PO TABS: ORAL_TABLET | ORAL | Status: DC

## 2015-04-05 NOTE — Assessment & Plan Note (Signed)
Symptoms do seem predominantly glenohumeral with an element of subscapularis dysfunction. Tramadol for pain, physical therapy, x-rays.  Return in one month, consider glenohumeral injection if no better.

## 2015-04-05 NOTE — Progress Notes (Signed)
   Subjective:    I'm seeing this patient as a consultation for:  Dr. Constance Goltz  CC: Right shoulder pain  HPI: Patient presents with a 3 month history of right shoulder pain. She cannot pinpoint a specific event that precipitated the pain but feels that it has been hurting more and more lately. She reports being a very active person who has a lot of home projects going on that require her to pull and push. She says that she has tried to rest it recently but is concerned because it is not getting better. Activity makes it worse, particularly with overhead lifting, and Naproxen makes it better but only temporarily. She also says that she does not take NSAIDs every day, just on work days, because she has a history of stomach ulcers and is afraid of getting another one. The pain radiates down the arm but never past the elbow. She denies numbness, tingling, or weakness.  Past medical history, Surgical history, Family history not pertinant except as noted below, Social history, Allergies, and medications have been entered into the medical record, reviewed, and no changes needed.   Review of Systems: No headache, visual changes, nausea, vomiting, diarrhea, constipation, dizziness, abdominal pain, skin rash, fevers, chills, night sweats, weight loss, swollen lymph nodes, body aches, joint swelling, muscle aches, chest pain, shortness of breath, mood changes, visual or auditory hallucinations.   Objective:   General: Well Developed, well nourished, and in no acute distress.  Neuro/Psych: Alert and oriented x3, extra-ocular muscles intact, able to move all 4 extremities, sensation grossly intact. Skin: Warm and dry, no rashes noted.  Respiratory: Not using accessory muscles, speaking in full sentences, trachea midline.  Cardiovascular: Pulses palpable, no extremity edema. Abdomen: Does not appear distended. Right Shoulder: Inspection reveals no abnormalities, atrophy or asymmetry. Palpation notable for  trapezius tenderness with no tenderness over AC joint or bicipital groove. Active ROM is limited with abduction and passive ROM is full in all planes. Rotator cuff strength normal throughout. Signs of impingement with positive Hawkin's test and negative Neer test, postive empty can sign, positive lift off test. Speeds and Yergason's tests normal. Labral pathology noted with negative Obrien's, positive clunk and good stability. Normal scapular function observed. No painful arc and no drop arm sign. No apprehension sign  Impression and Recommendations:   This case required medical decision making of moderate complexity.  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 Will start conservatively with PT and tramadol for pain relief as the patient has a history of stomach ulcers. Will also get X-rays to get baseline images of joint and r/o occult etiologies. Patient to return in 1 month. Will consider glenohumeral injection if not better by then.

## 2015-04-06 NOTE — Addendum Note (Signed)
Addended by: Monica Becton on: 04/06/2015 12:07 PM   Modules accepted: Orders

## 2015-04-10 ENCOUNTER — Ambulatory Visit (HOSPITAL_COMMUNITY): Payer: 59 | Attending: Sports Medicine

## 2015-04-10 ENCOUNTER — Encounter (HOSPITAL_COMMUNITY): Payer: Self-pay

## 2015-04-10 DIAGNOSIS — M629 Disorder of muscle, unspecified: Secondary | ICD-10-CM | POA: Insufficient documentation

## 2015-04-10 DIAGNOSIS — M25611 Stiffness of right shoulder, not elsewhere classified: Secondary | ICD-10-CM | POA: Insufficient documentation

## 2015-04-10 DIAGNOSIS — R29898 Other symptoms and signs involving the musculoskeletal system: Secondary | ICD-10-CM | POA: Insufficient documentation

## 2015-04-10 DIAGNOSIS — M6289 Other specified disorders of muscle: Secondary | ICD-10-CM

## 2015-04-10 DIAGNOSIS — M25511 Pain in right shoulder: Secondary | ICD-10-CM | POA: Diagnosis present

## 2015-04-10 NOTE — Patient Instructions (Signed)
  Flexibility: Corner Stretch   Standing in corner with hands just above shoulder level, lean forward until a comfortable stretch is felt across chest. Hold __20__ seconds. Repeat __2__ times per set. Do _1___ sets per session. Do _2___ sessions per day.  http://orth.exer.us/342   Copyright  VHI. All rights reserved.   Scapular Retraction (Standing)   With arms at sides, pinch shoulder blades together. Repeat __10__ times per set. Do __1__ sets per session.  http://orth.exer.us/944   Copyright  VHI. All rights reserved.   ROM: Towel Stretch - with Interior Rotation   Pull left arm up behind back by pulling towel up with other arm. Hold __20__ seconds. Repeat __2__ times per set. Do _1___ sets per session. Do _2___ sessions per day.  http://orth.exer.us/888   Copyright  VHI. All rights reserved.   Posterior Capsule Stretch   Stand or sit, one arm across body so hand rests over opposite shoulder. Gently push on crossed elbow with other hand until stretch is felt in shoulder of crossed arm. Hold _20__ seconds.  Repeat _2__ times per session. Do __2_ sessions per day.  Copyright  VHI. All rights reserved.   Flexors Stretch, Standing   Stand near wall and slide arm up, with palm facing away from wall, by leaning toward wall. Hold _20__ seconds.  Repeat _2__ times per session. Do _2__ sessions per day.  Copyright  VHI. All rights reserved.

## 2015-04-10 NOTE — Therapy (Signed)
[facility] [address] [place], [place], [address] Phone: [phone]   Fax:  [phone]  Occupational Therapy Evaluation  Patient Details  Name: Katie Ho MRN: [id number] Date of Birth: [birth year] Referring Provider: Rodney Langton  Encounter Date: 04/10/2015      OT End of Session - 04/10/15 1350    Visit Number 1   Number of Visits 12   Date for OT Re-Evaluation 06/09/15  mini reassess: 05/08/15   Authorization Type [employer] -employee. No visit limit. $20 copay   OT Start Time 1305   OT Stop Time 1345   OT Time Calculation (min) 40 min   Activity Tolerance Patient tolerated treatment well   Behavior During Therapy WFL for tasks assessed/performed      Past Medical History  Diagnosis Date  . Hyperlipidemia     Past Surgical History  Procedure Laterality Date  . Cesarean section    . Abdominal hysterectomy      There were no vitals filed for this visit.  Visit Diagnosis:  Pain in joint of right shoulder - Plan: Ot plan of care cert/re-cert  Shoulder stiffness, right - Plan: Ot plan of care cert/re-cert  Tight fascia - Plan: Ot plan of care cert/re-cert  Shoulder weakness - Plan: Ot plan of care cert/re-cert      Subjective Assessment - 04/10/15 1344    Subjective  S: I guess it was just from lifting and working. Everything's caught up with me.   Pertinent History Patient is a 55 y/o female S/P right shoulder pain which began approximately 3 months ago with no known injury. 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 Pt spends the majority of her time on the computer typing. Dr. Benjamin Stain has referred patient to occupational therapy for evaluation and treatment.    Limitations Unable to lift or raise her arm up completely.    Repetition Increases Symptoms   Special Tests FOTO score: 35/100   Patient Stated Goals To decrease  pain and increase mobility.    Currently in Pain? No/denies  With movement patient reports a pain level of 8/10 in Right shoulder           [facility] OT Assessment - 04/10/15 1306    Assessment   Diagnosis Right shoulder pain   Referring Provider Rodney Langton   Onset Date --  3 months   Assessment 1 month follow up.   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 patient has 2 grandchildren that live in [place].    ADL   ADL comments Difficulty completing all daily tasks with RUE. Raising up overhead and out to the side are very difficult. Patient is unable to complete any heavy lifting. Pain is increased with movement and use and has woken her up at night.    Mobility   Mobility Status Independent   Written Expression  Dominant Hand Right   Vision - History   Baseline Vision Wears glasses all the time   Cognition   Overall Cognitive Status Within Functional Limits for tasks assessed   ROM / Strength   AROM / PROM / Strength AROM;PROM;Strength   Palpation   Palpation comment Max fascial restrictions in upper trapezius and scapularis region.   AROM   Overall AROM Comments Assessed standing. IR/er adducted   AROM Assessment Site Shoulder   Right/Left Shoulder Right   Right Shoulder Flexion 149 Degrees  begins to have pain around 110 degrees   Right Shoulder ABduction 160 Degrees  begins to have pain at 60 degrees   Right Shoulder Internal Rotation 90 Degrees   Right Shoulder External Rotation 64 Degrees   PROM    Overall PROM Comments Assessed supine. IR/er adducted   PROM Assessment Site Shoulder   Right/Left Shoulder Right   Right Shoulder Flexion 180 Degrees   Right Shoulder ABduction 105 Degrees   Right Shoulder Internal Rotation 90 Degrees   Right Shoulder External Rotation 41 Degrees   Strength   Overall Strength Comments Assessed standing. IR/er adducted   Strength Assessment Site Shoulder   Right/Left Shoulder Right   Right Shoulder Flexion 4/5   Right Shoulder ABduction 4/5   Right Shoulder Internal Rotation 4+/5   Right Shoulder External Rotation 4-/5                         OT Education - 04/10/15 1347    Education provided Yes   Education Details Shoulder stretches   Person(s) Educated Patient   Methods Explanation;Demonstration;Verbal cues;Handout   Comprehension Returned demonstration;Verbalized understanding          OT Short Term Goals - 04/10/15 1354    OT SHORT TERM GOAL #1   Title Patient will be educated and independent with HEP to increase functional mobility of RUE.    Time 3   Period Weeks   Status New   OT SHORT TERM GOAL #2   Title Patient will increase P/ROM of RUE to increase ability to reach out and reach up with less difficulty.    Time 3   Period Weeks   Status New   OT SHORT TERM GOAL #3   Title Patient will decrease pain level in RUE to 5/10 with use with daily and work tasks.   Time 3   Period Weeks   Status New   OT SHORT TERM GOAL #4   Title Patient will increase RUE strength to 4+/5 to increase ability to lift household items.   Time 3   Period Weeks   Status New   OT SHORT TERM GOAL #5   Title Patient will decrease fascial restrictions in RUE to mod amount to increase functional mobility.   Time 3   Period Weeks   Status New           OT Long Term Goals - 04/10/15 1414    OT LONG TERM GOAL #1   Title Patient will return to highest level of independence with all daily and work related tasks with RUE.    Time 6    Period Weeks   Status New   OT LONG TERM GOAL #2   Title Patient will increase RUE strength to 5/5 to increase ability to lift grandchildren with less difficulty.    Time 6   Period Weeks   Status New   OT LONG TERM GOAL #3  Title Patient will increase A/ROM of RUE to WNL to increase ability to reach into overhead cabinets with less difficulty.   Time 6   Period Weeks   Status New   OT LONG TERM GOAL #4   Title Patient will decrease fascial restrictions in RUE to min amount or less to increase functional mobility.   Time 6   Period Weeks   Status New   OT LONG TERM GOAL #5   Title Patient will decrease pain level to 3/0 or less with RUE during daily and work related tasks.    Time 6   Period Weeks   Status New               Plan - 04/10/15 1351    Clinical Impression Statement A: Patient is a 55 y/o female S/P right shoulder pain causing increased fascial restrictions and pain and decreased strength and ROM resulting in difficulty completing daily and work related tasks with RUE.   Pt will benefit from skilled therapeutic intervention in order to improve on the following deficits (Retired) Decreased strength;Pain;Impaired UE functional use;Decreased range of motion;Increased fascial restricitons   Rehab Potential Excellent   OT Frequency 2x / week   OT Duration 6 weeks   OT Treatment/Interventions Self-care/ADL training;Ultrasound;Iontophoresis;Passive range of motion;Patient/family education;Cryotherapy;Electrical Stimulation;Moist Heat;Traction;Therapeutic activities;Therapeutic exercises;Manual Therapy   Plan P: Skilled OT services required to increase functional performance during daily and work related tasks using RUE as dominant. Treatment plan: Myofascial release, manual stretching, AA/ROM, A/ROM, scapular and shoulder stability and strengthening. Iontophoresis if needed for pain as well as heat, ice and ES.    Consulted and Agree with Plan of Care Patient         Problem List Patient Active Problem List   Diagnosis Date Noted  . Right shoulder pain 04/05/2015  . Cystocele 03/12/2013  . S/P total hysterectomy and BSO (bilateral salpingo-oophorectomy) 03/12/2013  . Vitamin D deficiency 03/12/2013  . LGSIL (low grade squamous intraepithelial dysplasia) 03/10/2013  . Family history of breast cancer 06/23/2012  . Hypertriglyceridemia 06/22/2012  . Rosacea 06/22/2012  . Osteopenia 06/22/2012  . Stress incontinence 06/22/2012    [name], OTR/L,CBIS  [phone]  04/10/2015, 2:24 PM   [facility] [address] [place], [place], [address] Phone: [phone]   Fax:  [phone]  Name: Katie Ho MRN: [id number] Date of Birth: 11/14/1960

## 2015-04-18 ENCOUNTER — Ambulatory Visit (HOSPITAL_COMMUNITY): Payer: 59 | Admitting: Specialist

## 2015-04-18 DIAGNOSIS — M6289 Other specified disorders of muscle: Secondary | ICD-10-CM

## 2015-04-18 DIAGNOSIS — R29898 Other symptoms and signs involving the musculoskeletal system: Secondary | ICD-10-CM

## 2015-04-18 DIAGNOSIS — M629 Disorder of muscle, unspecified: Secondary | ICD-10-CM | POA: Diagnosis not present

## 2015-04-18 DIAGNOSIS — M25511 Pain in right shoulder: Secondary | ICD-10-CM

## 2015-04-18 DIAGNOSIS — M25611 Stiffness of right shoulder, not elsewhere classified: Secondary | ICD-10-CM | POA: Diagnosis not present

## 2015-04-18 NOTE — Therapy (Signed)
[facility] [address] [place], [place], [address] Phone: [phone]   Fax:  [phone]  Occupational Therapy Treatment  Patient Details  Name: Katie Ho MRN: [id number] Date of Birth: 11-09-1960 Referring Provider: Rodney Langton  Encounter Date: 04/18/2015      OT End of Session - 04/18/15 1719    Visit Number 2   Number of Visits 12   Date for OT Re-Evaluation 06/09/15  mini reassess on 05/08/15   Authorization Type [employer] -employee. No visit limit. $20 copay   OT Start Time 1023   OT Stop Time 1103   OT Time Calculation (min) 40 min      Past Medical History  Diagnosis Date  . Hyperlipidemia     Past Surgical History  Procedure Laterality Date  . Cesarean section    . Abdominal hysterectomy      There were no vitals filed for this visit.  Visit Diagnosis:  Pain in joint of right shoulder  Shoulder stiffness, right  Tight fascia  Shoulder weakness      Subjective Assessment - 04/18/15 1027    Subjective  S:  I worked 2 12 hours shifts this weekend and it is sore, but not like it was.    Currently in Pain? Yes   Pain Score 2    Pain Location Shoulder   Pain Orientation Right   Pain Descriptors / Indicators Sharp   Pain Type Acute pain   Pain Radiating Towards elbow   Pain Onset More than a month ago   Pain Frequency Intermittent   Aggravating Factors  reaching out to the side   Pain Relieving Factors resting   Effect of Pain on Daily Activities unable to use right arm fully            [facility] OT Assessment - 04/18/15 0001    Assessment   Diagnosis Right shoulder pain   Prior Therapy None   Precautions   Precautions None                  OT Treatments/Exercises (OP) - 04/18/15 0001    Exercises   Exercises Shoulder   Shoulder Exercises: Supine   Protraction PROM;10 reps   Horizontal ABduction PROM;10 reps   External Rotation AROM;10 reps   Internal Rotation AROM;10  reps   Flexion PROM;10 reps   ABduction PROM;10 reps   Other Supine Exercises serratus anterior A/ROM 10 times    Shoulder Exercises: Seated   Elevation AROM;10 reps   Extension AROM;10 reps   Retraction AROM;10 reps   Row AROM;10 reps   Manual Therapy   Manual Therapy Myofascial release   Manual therapy comments manual therapy completed seperately from all other interventions this date.    Myofascial Release myofascial release and manual stretching to right upper arm, scapular region, and associated areas to decrease pain and restrictions and improve pain free mobility in her right shoulder region.                 OT Education - 04/18/15 1720    Education provided Yes   Education Details issued Plan of Care and reviewed treatment goals with patient   Person(s) Educated Patient   Methods Explanation;Handout   Comprehension Verbalized understanding          OT Short Term Goals - 04/18/15 1100    OT SHORT TERM GOAL #1   Title Patient will be educated and independent with HEP to increase functional mobility  of RUE.    Time 3   Period Weeks   Status On-going   OT SHORT TERM GOAL #2   Title Patient will increase P/ROM of RUE to increase ability to reach out and reach up with less difficulty.    Time 3   Period Weeks   Status On-going   OT SHORT TERM GOAL #3   Title Patient will decrease pain level in RUE to 5/10 with use with daily and work tasks.   Time 3   Period Weeks   Status On-going   OT SHORT TERM GOAL #4   Title Patient will increase RUE strength to 4+/5 to increase ability to lift household items.   Time 3   Period Weeks   Status On-going   OT SHORT TERM GOAL #5   Title Patient will decrease fascial restrictions in RUE to mod amount to increase functional mobility.   Time 3   Period Weeks   Status On-going           OT Long Term Goals - 04/18/15 1100    OT LONG TERM GOAL #1   Title Patient will return to highest level of independence with all  daily and work related tasks with RUE.    Time 6   Period Weeks   Status On-going   OT LONG TERM GOAL #2   Title Patient will increase RUE strength to 5/5 to increase ability to lift grandchildren with less difficulty.    Time 6   Period Weeks   Status On-going   OT LONG TERM GOAL #3   Title Patient will increase A/ROM of RUE to WNL to increase ability to reach into overhead cabinets with less difficulty.   Time 6   Period Weeks   Status On-going   OT LONG TERM GOAL #4   Title Patient will decrease fascial restrictions in RUE to min amount or less to increase functional mobility.   Time 6   Period Weeks   Status On-going   OT LONG TERM GOAL #5   Title Patient will decrease pain level to 3/0 or less with RUE during daily and work related tasks.    Time 6   Period Weeks   Status On-going               Plan - 04/18/15 1720    Clinical Impression Statement A:   Patient began AA/ROM this date.  Patient is able to complete, however has increased pain and audible popping in her right shoulder region during P/ROM.     Plan P:  Patient will complete AA/ROM with less pain and greater ease.  Begin wall wash, thumbtacks, protreatction/retraction/ elevation/depression.  Begin electrical stimulation if pain level remails high.        Problem List Patient Active Problem List   Diagnosis Date Noted  . Right shoulder pain 04/05/2015  . Cystocele 03/12/2013  . S/P total hysterectomy and BSO (bilateral salpingo-oophorectomy) 03/12/2013  . Vitamin D deficiency 03/12/2013  . LGSIL (low grade squamous intraepithelial dysplasia) 03/10/2013  . Family history of breast cancer 06/23/2012  . Hypertriglyceridemia 06/22/2012  . Rosacea 06/22/2012  . Osteopenia 06/22/2012  . Stress incontinence 06/22/2012    Shirlean Mylar, OTR/L [phone]  04/18/2015, 5:37 PM  [facility] [address] [place], [place], [address] Phone: [phone]    Fax:  [phone]  Name: Katie Ho MRN: [id number] Date of Birth: March 16, 1960

## 2015-04-20 ENCOUNTER — Ambulatory Visit (HOSPITAL_COMMUNITY): Payer: 59

## 2015-04-20 DIAGNOSIS — M25611 Stiffness of right shoulder, not elsewhere classified: Secondary | ICD-10-CM | POA: Diagnosis not present

## 2015-04-20 DIAGNOSIS — R29898 Other symptoms and signs involving the musculoskeletal system: Secondary | ICD-10-CM | POA: Diagnosis not present

## 2015-04-20 DIAGNOSIS — M6289 Other specified disorders of muscle: Secondary | ICD-10-CM

## 2015-04-20 DIAGNOSIS — M25511 Pain in right shoulder: Secondary | ICD-10-CM | POA: Diagnosis not present

## 2015-04-20 DIAGNOSIS — M629 Disorder of muscle, unspecified: Secondary | ICD-10-CM

## 2015-04-20 NOTE — Patient Instructions (Signed)
Perform each exercise ___10-12_____ reps. 2-3x days.   Protraction - STANDING  Start by holding a wand or cane at chest height.  Next, slowly push the wand outwards in front of your body so that your elbows become fully straightened. Then, return to the original position.     Shoulder FLEXION - STANDING - PALMS DOWN  In the standing position, hold a wand/cane with both arms, palms down on both sides. Raise up the wand/cane allowing your unaffected arm to perform most of the effort. Your affected arm should be partially relaxed.      Internal/External ROTATION - STANDING  In the standing position, hold a wand/cane with both hands keeping your elbows bent. Move your arms and wand/cane to one side.  Your affected arm should be partially relaxed while your unaffected arm performs most of the effort.       Shoulder ABDUCTION - STANDING  While holding a wand/cane palm face up on the injured side and palm face down on the uninjured side, slowly raise up your injured arm to the side.       Horizontal Abduction/Adduction      Straight arms holding cane at shoulder height, bring cane to right, center, left. Repeat starting to left.   Copyright  VHI. All rights reserved.     Shoulder ER stretch  place hand in doorway as shown with your elbow at a 90 degree angle. Gently translate your whole body forward until you feel a stretch in the front of your shoulder. Hold stretch for 10 seconds, then slowly back out of stretch.  Repeat 3 times.     EXTERNAL ROTATION STRETCH  Place arm along edge of door as pictured.  Keep elbow and shoulder at 90 degree angles.  Gently lean the upper torso forward until a stretch is felt in the shoulder. Hold __10_ seconds; relax; repeat 3 times

## 2015-04-20 NOTE — Therapy (Signed)
[facility] [address] [place], [place], [address] Phone: [phone]   Fax:  [phone]  Occupational Therapy Treatment  Patient Details  Name: Katie Ho MRN: [id number] Date of Birth: Apr 15, 1960 Referring Provider: Rodney Langton  Encounter Date: 04/20/2015      OT End of Session - 04/20/15 1129    Visit Number 3   Number of Visits 12   Date for OT Re-Evaluation 06/09/15  mini reassess on 05/08/15   Authorization Type [employer] -employee. No visit limit. $20 copay   OT Start Time 1020   OT Stop Time 1100   OT Time Calculation (min) 40 min   Activity Tolerance Patient tolerated treatment well   Behavior During Therapy WFL for tasks assessed/performed      Past Medical History  Diagnosis Date  . Hyperlipidemia     Past Surgical History  Procedure Laterality Date  . Cesarean section    . Abdominal hysterectomy      There were no vitals filed for this visit.  Visit Diagnosis:  Shoulder weakness  Shoulder stiffness, right  Tight fascia      Subjective Assessment - 04/20/15 1127    Subjective  S: I tried doing the exercises I did last time at home. They went pretty well from the ones I can remember.   Currently in Pain? No/denies            [facility] OT Assessment - 04/20/15 1128    Assessment   Diagnosis Right shoulder pain   Precautions   Precautions None                  OT Treatments/Exercises (OP) - 04/20/15 1128    Manual Therapy   Muscle Energy Technique Attempted muscle energy technique to right medial deltoid to decrease muscle spasms and increase ROM. Experienced increased pain and therapist did not complete.                 OT Education - 04/20/15 1129    Education provided Yes   Education Details AA/ROM exercises and External rotation stretches   Person(s) Educated Patient   Methods Explanation;Demonstration;Handout;Verbal cues   Comprehension Returned  demonstration;Verbalized understanding          OT Short Term Goals - 04/18/15 1100    OT SHORT TERM GOAL #1   Title Patient will be educated and independent with HEP to increase functional mobility of RUE.    Time 3   Period Weeks   Status On-going   OT SHORT TERM GOAL #2   Title Patient will increase P/ROM of RUE to increase ability to reach out and reach up with less difficulty.    Time 3   Period Weeks   Status On-going   OT SHORT TERM GOAL #3   Title Patient will decrease pain level in RUE to 5/10 with use with daily and work tasks.   Time 3   Period Weeks   Status On-going   OT SHORT TERM GOAL #4   Title Patient will increase RUE strength to 4+/5 to increase ability to lift household items.   Time 3   Period Weeks   Status On-going   OT SHORT TERM GOAL #5   Title Patient will decrease fascial restrictions in RUE to mod amount to increase functional mobility.   Time 3   Period Weeks   Status On-going           OT Long Term Goals - 04/18/15  1100    OT LONG TERM GOAL #1   Title Patient will return to highest level of independence with all daily and work related tasks with RUE.    Time 6   Period Weeks   Status On-going   OT LONG TERM GOAL #2   Title Patient will increase RUE strength to 5/5 to increase ability to lift grandchildren with less difficulty.    Time 6   Period Weeks   Status On-going   OT LONG TERM GOAL #3   Title Patient will increase A/ROM of RUE to WNL to increase ability to reach into overhead cabinets with less difficulty.   Time 6   Period Weeks   Status On-going   OT LONG TERM GOAL #4   Title Patient will decrease fascial restrictions in RUE to min amount or less to increase functional mobility.   Time 6   Period Weeks   Status On-going   OT LONG TERM GOAL #5   Title Patient will decrease pain level to 3/0 or less with RUE during daily and work related tasks.    Time 6   Period Weeks   Status On-going               Plan -  04/20/15 1129    Clinical Impression Statement A: Patient experienced more comfort in Right shoulder when completing exercises standing versus supine. Muscle spasms and pain felt with passive stretching during er. Pt was able to achieve more a stretch when completing independently. HEP updated. Pt reports that she has increased difficulty with corner stretch and is not able to stretch very far  due to muscle stiffness.   Plan P: Add scapular theraband exercises. Follow up on updated HEP and er pain during stretching.        Problem List Patient Active Problem List   Diagnosis Date Noted  . Right shoulder pain 04/05/2015  . Cystocele 03/12/2013  . S/P total hysterectomy and BSO (bilateral salpingo-oophorectomy) 03/12/2013  . Vitamin D deficiency 03/12/2013  . LGSIL (low grade squamous intraepithelial dysplasia) 03/10/2013  . Family history of breast cancer 06/23/2012  . Hypertriglyceridemia 06/22/2012  . Rosacea 06/22/2012  . Osteopenia 06/22/2012  . Stress incontinence 06/22/2012    [name], OTR/L,CBIS  [phone]  04/20/2015, 11:36 AM  [facility] [address] [place], [place], [address] Phone: [phone]   Fax:  [phone]  Name: Katie Ho MRN: [id number] Date of Birth: January 22, 1961

## 2015-04-23 ENCOUNTER — Ambulatory Visit (HOSPITAL_COMMUNITY): Payer: 59 | Admitting: Specialist

## 2015-04-23 DIAGNOSIS — M6289 Other specified disorders of muscle: Secondary | ICD-10-CM

## 2015-04-23 DIAGNOSIS — M25511 Pain in right shoulder: Secondary | ICD-10-CM

## 2015-04-23 DIAGNOSIS — M629 Disorder of muscle, unspecified: Secondary | ICD-10-CM

## 2015-04-23 DIAGNOSIS — M25611 Stiffness of right shoulder, not elsewhere classified: Secondary | ICD-10-CM | POA: Diagnosis not present

## 2015-04-23 DIAGNOSIS — R29898 Other symptoms and signs involving the musculoskeletal system: Secondary | ICD-10-CM | POA: Diagnosis not present

## 2015-04-23 NOTE — Patient Instructions (Signed)
COMPLETE EACH EXERCISE 10 - 15 TIMES EACH, 2 TIMES PER DAY. [phone]  (Home) Extension: Isometric / Bilateral Arm Retraction - Sitting   Facing anchor, hold hands and elbow at shoulder height, with elbow bent.  Pull arms back to squeeze shoulder blades together. Repeat 10-15 times.  Copyright  VHI. All rights reserved.   (Home) Retraction: Row - Bilateral (Anchor)   Facing anchor, arms reaching forward, pull hands toward stomach, keeping elbows bent and at your sides and pinching shoulder blades together. Repeat 10-15 times.  Copyright  VHI. All rights reserved.   (Clinic) Extension / Flexion (Assist)   Face anchor, pull arms back, keeping elbow straight, and squeze shoulder blades together. Repeat 10-15 times.   Copyright  VHI. All rights reserved.  Strengthening: Resisted External Rotation    Hold tubing in right hand, elbow at side and forearm across body. Rotate forearm out. Repeat ____ times per set. Do ____ sets per session. Do ____ sessions per day.  http://orth.exer.us/829   Copyright  VHI. All rights reserved.  Strengthening: Resisted Internal Rotation    Hold tubing in left hand, elbow at side and forearm out. Rotate forearm in across body. Repeat ____ times per set. Do ____ sets per session. Do ____ sessions per day.  http://orth.exer.us/831   Copyright  VHI. All rights reserved.

## 2015-04-23 NOTE — Therapy (Signed)
[facility] [address] [place], [place], [address] Phone: [phone]   Fax:  [phone]  Occupational Therapy Treatment  Patient Details  Name: Katie Ho MRN: [id number] Date of Birth: 01-12-1961 Referring Provider: Rodney Langton  Encounter Date: 04/23/2015      OT End of Session - 04/23/15 1616    Visit Number 4   Number of Visits 12   Date for OT Re-Evaluation 06/09/15  mini reassess on 05/08/15   Authorization Type [employer] -employee. No visit limit. $20 copay   OT Start Time 1522   OT Stop Time 1602   OT Time Calculation (min) 40 min   Activity Tolerance Patient tolerated treatment well   Behavior During Therapy [facility] for tasks assessed/performed      Past Medical History  Diagnosis Date  . Hyperlipidemia     Past Surgical History  Procedure Laterality Date  . Cesarean section    . Abdominal hysterectomy      There were no vitals filed for this visit.  Visit Diagnosis:  Pain in joint of right shoulder  Tight fascia      Subjective Assessment - 04/23/15 1614    Subjective  S:  I am getting better except for a few things, like reaching into the floor board or reaching out in front of me is still very painful.   Currently in Pain? No/denies            [facility] OT Assessment - 04/23/15 0001    Assessment   Diagnosis Right shoulder pain   Precautions   Precautions None                  OT Treatments/Exercises (OP) - 04/23/15 0001    Exercises   Exercises Shoulder   Shoulder Exercises: Supine   Protraction PROM;5 reps;AAROM;12 reps   Protraction Limitations pain in posterior arm when descending    Horizontal ABduction PROM;5 reps;AAROM;12 reps   External Rotation PROM;5 reps;AAROM;12 reps   Internal Rotation PROM;5 reps;AAROM;12 reps   Flexion PROM;5 reps;AAROM;12 reps   ABduction PROM;5 reps;AAROM;12 reps   Shoulder Exercises: Standing   External Rotation Theraband;12 reps   Theraband Level (Shoulder External Rotation) Level 2 (Red)   Internal Rotation Theraband;12 reps   Theraband Level (Shoulder Internal Rotation) Level 2 (Red)   Extension Theraband;12 reps   Theraband Level (Shoulder Extension) Level 2 (Red)   Row Theraband;12 reps   Theraband Level (Shoulder Row) Level 2 (Red)   Retraction Theraband;12 reps   Theraband Level (Shoulder Retraction) Level 2 (Red)   Shoulder Exercises: ROM/Strengthening   UBE (Upper Arm Bike) 3 minutes in reverse to focus on scapular stability at level 1.0   Manual Therapy   Manual Therapy Myofascial release   Manual therapy comments manual therapy completed seperately from all other interventions this date.    Myofascial Release myofascial release and manual stretching to right upper arm, scapular region, and associated areas to decrease pain and restrictions and improve pain free mobility in her right shoulder region.                 OT Education - 04/23/15 1616    Education provided Yes   Education Details scapular theraband   Person(s) Educated Patient   Methods Explanation;Demonstration;Handout   Comprehension Verbalized understanding;Returned demonstration          OT Short Term Goals - 04/18/15 1100    OT SHORT TERM GOAL #1   Title Patient will be  educated and independent with HEP to increase functional mobility of RUE.    Time 3   Period Weeks   Status On-going   OT SHORT TERM GOAL #2   Title Patient will increase P/ROM of RUE to increase ability to reach out and reach up with less difficulty.    Time 3   Period Weeks   Status On-going   OT SHORT TERM GOAL #3   Title Patient will decrease pain level in RUE to 5/10 with use with daily and work tasks.   Time 3   Period Weeks   Status On-going   OT SHORT TERM GOAL #4   Title Patient will increase RUE strength to 4+/5 to increase ability to lift household items.   Time 3   Period Weeks   Status On-going   OT SHORT TERM GOAL #5   Title  Patient will decrease fascial restrictions in RUE to mod amount to increase functional mobility.   Time 3   Period Weeks   Status On-going           OT Long Term Goals - 04/18/15 1100    OT LONG TERM GOAL #1   Title Patient will return to highest level of independence with all daily and work related tasks with RUE.    Time 6   Period Weeks   Status On-going   OT LONG TERM GOAL #2   Title Patient will increase RUE strength to 5/5 to increase ability to lift grandchildren with less difficulty.    Time 6   Period Weeks   Status On-going   OT LONG TERM GOAL #3   Title Patient will increase A/ROM of RUE to WNL to increase ability to reach into overhead cabinets with less difficulty.   Time 6   Period Weeks   Status On-going   OT LONG TERM GOAL #4   Title Patient will decrease fascial restrictions in RUE to min amount or less to increase functional mobility.   Time 6   Period Weeks   Status On-going   OT LONG TERM GOAL #5   Title Patient will decrease pain level to 3/0 or less with RUE during daily and work related tasks.    Time 6   Period Weeks   Status On-going               Plan - 04/23/15 1617    Clinical Impression Statement A:  Patient has full P/ROM in supine, however has pain at 100 of abduction and external rotation is very painful for patient with arm adducted, pain lessens with arm abducted and external rotation.  Added scapular theraband exercises and retraction on UBE this date to focus on strengthening of scapular muscles to promote improved shoulder alignment    Plan P:  MFR to latissimus dorsi and posterior upper arm to decrease pain in this associated area.  Attempt A/ROM in supine with less pain.         Problem List Patient Active Problem List   Diagnosis Date Noted  . Right shoulder pain 04/05/2015  . Cystocele 03/12/2013  . S/P total hysterectomy and BSO (bilateral salpingo-oophorectomy) 03/12/2013  . Vitamin D deficiency 03/12/2013  .  LGSIL (low grade squamous intraepithelial dysplasia) 03/10/2013  . Family history of breast cancer 06/23/2012  . Hypertriglyceridemia 06/22/2012  . Rosacea 06/22/2012  . Osteopenia 06/22/2012  . Stress incontinence 06/22/2012    Shirlean Mylar, OTR/L [phone]  04/23/2015, 4:22 PM  [facility] [address] [place], [place], [address] Phone: [phone]   Fax:  [phone]  Name: Katie Ho MRN: [id number] Date of Birth: Jan 10, 1961

## 2015-04-25 ENCOUNTER — Ambulatory Visit (HOSPITAL_COMMUNITY): Payer: 59

## 2015-04-25 ENCOUNTER — Encounter (HOSPITAL_COMMUNITY): Payer: Self-pay

## 2015-04-25 DIAGNOSIS — M25611 Stiffness of right shoulder, not elsewhere classified: Secondary | ICD-10-CM | POA: Diagnosis not present

## 2015-04-25 DIAGNOSIS — R29898 Other symptoms and signs involving the musculoskeletal system: Secondary | ICD-10-CM | POA: Diagnosis not present

## 2015-04-25 DIAGNOSIS — M25511 Pain in right shoulder: Secondary | ICD-10-CM | POA: Diagnosis not present

## 2015-04-25 DIAGNOSIS — M629 Disorder of muscle, unspecified: Secondary | ICD-10-CM | POA: Diagnosis not present

## 2015-04-25 DIAGNOSIS — M6289 Other specified disorders of muscle: Secondary | ICD-10-CM

## 2015-04-25 NOTE — Therapy (Signed)
[facility] [address] [place], [place], [address] Phone: [phone]   Fax:  [phone]  Occupational Therapy Treatment  Patient Details  Name: Katie Ho MRN: [id number] Date of Birth: 1960/09/05 Referring Provider: Rodney Langton  Encounter Date: 04/25/2015      OT End of Session - 04/25/15 1120    Visit Number 5   Number of Visits 12   Date for OT Re-Evaluation 06/09/15  mini reassess on 05/08/15   Authorization Type [employer] -employee. No visit limit. $20 copay   OT Start Time 1100   OT Stop Time 1145   OT Time Calculation (min) 45 min   Activity Tolerance Patient tolerated treatment well   Behavior During Therapy WFL for tasks assessed/performed      Past Medical History  Diagnosis Date  . Hyperlipidemia     Past Surgical History  Procedure Laterality Date  . Cesarean section    . Abdominal hysterectomy      There were no vitals filed for this visit.  Visit Diagnosis:  Tight fascia  Shoulder stiffness, right  Shoulder weakness      Subjective Assessment - 04/25/15 1118    Subjective  S: I don't have any pain today. I don't think it's as bad as it was the last time.    Currently in Pain? No/denies            [facility] OT Assessment - 04/25/15 1120    Assessment   Diagnosis Right shoulder pain   Precautions   Precautions None                  OT Treatments/Exercises (OP) - 04/25/15 1121    Exercises   Exercises Shoulder   Shoulder Exercises: Supine   Protraction PROM;5 reps;AROM;12 reps   Horizontal ABduction PROM;5 reps;AROM;12 reps   External Rotation PROM;5 reps;AROM;12 reps   Internal Rotation PROM;5 reps;AROM;12 reps   Flexion PROM;5 reps;AROM;12 reps   ABduction PROM;5 reps;AROM;12 reps   Shoulder Exercises: Standing   Protraction AROM;12 reps   Horizontal ABduction AROM;12 reps   External Rotation AROM;12 reps   Internal Rotation AROM;12 reps   Flexion AROM;12 reps   ABduction AROM;12 reps   Extension Theraband;12 reps   Theraband Level (Shoulder Extension) Level 2 (Red)   Row Theraband;12 reps   Theraband Level (Shoulder Row) Level 2 (Red)   Retraction Theraband;12 reps   Shoulder Exercises: ROM/Strengthening   UBE (Upper Arm Bike) 3 minutes in reverse to focus on scapular stability at level 1.0   "W" Arms 10X   X to V Arms 12X   Proximal Shoulder Strengthening, Supine 12X no rest breaks   Proximal Shoulder Strengthening, Seated 12X no rest breaks   Manual Therapy   Manual Therapy Myofascial release   Manual therapy comments manual therapy completed seperately from all other interventions this date.    Myofascial Release myofascial release and manual stretching to right upper arm, scapular region, and associated areas to decrease pain and restrictions and improve pain free mobility in her right shoulder region.                   OT Short Term Goals - 04/18/15 1100    OT SHORT TERM GOAL #1   Title Patient will be educated and independent with HEP to increase functional mobility of RUE.    Time 3   Period Weeks   Status On-going   OT SHORT TERM GOAL #2   Title  Patient will increase P/ROM of RUE to increase ability to reach out and reach up with less difficulty.    Time 3   Period Weeks   Status On-going   OT SHORT TERM GOAL #3   Title Patient will decrease pain level in RUE to 5/10 with use with daily and work tasks.   Time 3   Period Weeks   Status On-going   OT SHORT TERM GOAL #4   Title Patient will increase RUE strength to 4+/5 to increase ability to lift household items.   Time 3   Period Weeks   Status On-going   OT SHORT TERM GOAL #5   Title Patient will decrease fascial restrictions in RUE to mod amount to increase functional mobility.   Time 3   Period Weeks   Status On-going           OT Long Term Goals - 04/18/15 1100    OT LONG TERM GOAL #1   Title Patient will return to highest level of independence with  all daily and work related tasks with RUE.    Time 6   Period Weeks   Status On-going   OT LONG TERM GOAL #2   Title Patient will increase RUE strength to 5/5 to increase ability to lift grandchildren with less difficulty.    Time 6   Period Weeks   Status On-going   OT LONG TERM GOAL #3   Title Patient will increase A/ROM of RUE to WNL to increase ability to reach into overhead cabinets with less difficulty.   Time 6   Period Weeks   Status On-going   OT LONG TERM GOAL #4   Title Patient will decrease fascial restrictions in RUE to min amount or less to increase functional mobility.   Time 6   Period Weeks   Status On-going   OT LONG TERM GOAL #5   Title Patient will decrease pain level to 3/0 or less with RUE during daily and work related tasks.    Time 6   Period Weeks   Status On-going               Plan - 04/25/15 1143    Clinical Impression Statement A: Pt with reports of decreased pain in latissimus dorsi region. Completed myofascial release prone and supine to focus on all regions of shoulder. Patient able to complete A/ROM supine and standing. VC for form and technique   Plan P: Increase to green theraband. Measure for MD appointment on 3/2.        Problem List Patient Active Problem List   Diagnosis Date Noted  . Right shoulder pain 04/05/2015  . Cystocele 03/12/2013  . S/P total hysterectomy and BSO (bilateral salpingo-oophorectomy) 03/12/2013  . Vitamin D deficiency 03/12/2013  . LGSIL (low grade squamous intraepithelial dysplasia) 03/10/2013  . Family history of breast cancer 06/23/2012  . Hypertriglyceridemia 06/22/2012  . Rosacea 06/22/2012  . Osteopenia 06/22/2012  . Stress incontinence 06/22/2012    Katie Ho, OTR/L,CBIS  [phone]  04/25/2015, 11:45 AM  [facility] [address] [place], [place], [address] Phone: [phone]   Fax:  [phone]  Name: Katie Ho MRN:  [id number] Date of Birth: February 25, 1961

## 2015-04-30 MED FILL — ESTRADIOL 2 MG TABLET: 2 | 90 days supply | Qty: 90 | Fill #1

## 2015-05-01 ENCOUNTER — Other Ambulatory Visit (HOSPITAL_BASED_OUTPATIENT_CLINIC_OR_DEPARTMENT_OTHER): Payer: Self-pay | Admitting: Internal Medicine

## 2015-05-01 DIAGNOSIS — Z1231 Encounter for screening mammogram for malignant neoplasm of breast: Secondary | ICD-10-CM

## 2015-05-02 ENCOUNTER — Encounter (HOSPITAL_COMMUNITY): Payer: Self-pay

## 2015-05-02 ENCOUNTER — Ambulatory Visit (HOSPITAL_COMMUNITY): Payer: 59 | Attending: Sports Medicine

## 2015-05-02 DIAGNOSIS — M25611 Stiffness of right shoulder, not elsewhere classified: Secondary | ICD-10-CM | POA: Diagnosis present

## 2015-05-02 DIAGNOSIS — R29898 Other symptoms and signs involving the musculoskeletal system: Secondary | ICD-10-CM | POA: Diagnosis present

## 2015-05-02 NOTE — Therapy (Signed)
[place], [place], [address] Phone: [phone]   Fax:  [phone]  Occupational Therapy Treatment And reassessment  Patient Details  Name: Katie Ho MRN: [id number] Date of Birth: [birth year] Referring Provider: Aundria Mems  Encounter Date: 05/02/2015      OT End of Session - 05/02/15 1047    Visit Number 6   Number of Visits 12   Authorization Type [employer] -employee. No visit limit. $20 copay   OT Start Time 1020  reassesment   OT Stop Time 1035   OT Time Calculation (min) 15 min   Activity Tolerance Patient tolerated treatment well   Behavior During Therapy WFL for tasks assessed/performed      Past Medical History  Diagnosis Date  . Hyperlipidemia     Past Surgical History  Procedure Laterality Date  . Cesarean section    . Abdominal hysterectomy      There were no vitals filed for this visit.  Visit Diagnosis:  Shoulder weakness  Shoulder stiffness, right          [facility] OT Assessment - 05/02/15 1021    Assessment   Diagnosis Right shoulder pain   Precautions   Precautions None   AROM   Overall AROM Comments Assessed standing. IR/er adducted   AROM Assessment Site Shoulder   Right/Left Shoulder Right   Right Shoulder Flexion 160 Degrees  previous: 149   Right Shoulder ABduction 170 Degrees  previous: 160   Right Shoulder Internal Rotation 90 Degrees  previous: same   Right Shoulder External Rotation 70 Degrees  previous: 64   Strength   Overall Strength Comments Assessed standing. IR/er adducted   Strength Assessment Site Shoulder   Right/Left Shoulder Right   Right Shoulder Flexion 5/5  previous: 4/5   Right Shoulder ABduction 5/5  previous: 4/5   Right Shoulder Internal Rotation 5/5  previous: 4+/5   Right Shoulder External Rotation 4+/5  previous: 4-/5                          OT Education - 05/02/15 1046    Education provided Yes    Education Details Reviewed goals and progress in therapy. Recommendations for completion of HEP at home.   Person(s) Educated Patient   Methods Explanation   Comprehension Verbalized understanding          OT Short Term Goals - 05/02/15 1026    OT SHORT TERM GOAL #1   Title Patient will be educated and independent with HEP to increase functional mobility of RUE.    Time 3   Period Weeks   Status Achieved   OT SHORT TERM GOAL #2   Title Patient will increase P/ROM of RUE to increase ability to reach out and reach up with less difficulty.    Time 3   Period Weeks   Status Achieved   OT SHORT TERM GOAL #3   Title Patient will decrease pain level in RUE to 5/10 with use with daily and work tasks.   Time 3   Period Weeks   Status Achieved   OT SHORT TERM GOAL #4   Title Patient will increase RUE strength to 4+/5 to increase ability to lift household items.   Time 3   Period Weeks   Status Achieved   OT SHORT TERM GOAL #5   Title Patient will decrease fascial restrictions in RUE to mod amount to increase functional mobility.  Time 3   Period Weeks   Status Achieved           OT Long Term Goals - 05/02/15 1027    OT LONG TERM GOAL #1   Title Patient will return to highest level of independence with all daily and work related tasks with RUE.    Time 6   Period Weeks   Status Achieved   OT LONG TERM GOAL #2   Title Patient will increase RUE strength to 5/5 to increase ability to lift grandchildren with less difficulty.    Time 6   Period Weeks   Status Achieved   OT LONG TERM GOAL #3   Title Patient will increase A/ROM of RUE to WNL to increase ability to reach into overhead cabinets with less difficulty.   Time 6   Period Weeks   Status Achieved   OT LONG TERM GOAL #4   Title Patient will decrease fascial restrictions in RUE to min amount or less to increase functional mobility.   Time 6   Period Weeks   Status Achieved   OT LONG TERM GOAL #5   Title Patient  will decrease pain level to 3/0 or less with RUE during daily and work related tasks.    Time 6   Period Weeks   Status Achieved               Plan - 05/02/15 1048    Clinical Impression Statement A: Measurements and reassessment completed today for MD appointment tomorrow. Pt reports zero pain and discomfort after completing her work shift this past week. Patient is happy with progress and feels ready for discharge. Pt has met all therapy goals.    Plan P: D/C with HEP.         Problem List Patient Active Problem List   Diagnosis Date Noted  . Right shoulder pain 04/05/2015  . Cystocele 03/12/2013  . S/P total hysterectomy and BSO (bilateral salpingo-oophorectomy) 03/12/2013  . Vitamin D deficiency 03/12/2013  . LGSIL (low grade squamous intraepithelial dysplasia) 03/10/2013  . Family history of breast cancer 06/23/2012  . Hypertriglyceridemia 06/22/2012  . Rosacea 06/22/2012  . Osteopenia 06/22/2012  . Stress incontinence 06/22/2012  OCCUPATIONAL THERAPY DISCHARGE SUMMARY  Visits from Start of Care: 6  Current functional level related to goals / functional outcomes: See above   Remaining deficits: None   Education / Equipment: Work modification, stretches, scapular strengthening with theraband Plan: Patient agrees to discharge.  Patient goals were met. Patient is being discharged due to meeting the stated rehab goals.  ?????       Ailene Ravel, OTR/L,CBIS  [phone]  05/02/2015, 10:49 AM  [place], [place], [address] Phone: [phone]   Fax:  [phone]  Name: Katie Ho MRN: [id number] Date of Birth: [birth year]

## 2015-05-03 ENCOUNTER — Ambulatory Visit: Payer: 59 | Admitting: Sports Medicine

## 2015-05-04 ENCOUNTER — Encounter (HOSPITAL_COMMUNITY): Payer: 59

## 2015-05-07 ENCOUNTER — Encounter (HOSPITAL_COMMUNITY): Payer: 59

## 2015-05-09 ENCOUNTER — Encounter (HOSPITAL_COMMUNITY): Payer: 59 | Admitting: Specialist

## 2015-05-10 ENCOUNTER — Inpatient Hospital Stay (HOSPITAL_BASED_OUTPATIENT_CLINIC_OR_DEPARTMENT_OTHER): Admission: RE | Admit: 2015-05-10 | Payer: 59 | Source: Ambulatory Visit

## 2015-06-06 DIAGNOSIS — E559 Vitamin D deficiency, unspecified: Secondary | ICD-10-CM | POA: Diagnosis not present

## 2015-06-06 DIAGNOSIS — Z0000 Encounter for general adult medical examination without abnormal findings: Secondary | ICD-10-CM | POA: Diagnosis not present

## 2015-06-07 ENCOUNTER — Ambulatory Visit (HOSPITAL_BASED_OUTPATIENT_CLINIC_OR_DEPARTMENT_OTHER)
Admission: RE | Admit: 2015-06-07 | Discharge: 2015-06-07 | Disposition: A | Discharge status: Home / Self Care | Payer: 59 | Source: Ambulatory Visit | Attending: Internal Medicine | Admitting: Internal Medicine

## 2015-06-07 DIAGNOSIS — Z1231 Encounter for screening mammogram for malignant neoplasm of breast: Secondary | ICD-10-CM | POA: Insufficient documentation

## 2015-06-07 DIAGNOSIS — Z1212 Encounter for screening for malignant neoplasm of rectum: Secondary | ICD-10-CM | POA: Diagnosis not present

## 2015-06-20 DIAGNOSIS — Z8249 Family history of ischemic heart disease and other diseases of the circulatory system: Secondary | ICD-10-CM | POA: Diagnosis not present

## 2015-06-20 DIAGNOSIS — Z0000 Encounter for general adult medical examination without abnormal findings: Secondary | ICD-10-CM | POA: Diagnosis not present

## 2015-06-20 DIAGNOSIS — M859 Disorder of bone density and structure, unspecified: Secondary | ICD-10-CM | POA: Diagnosis not present

## 2015-06-20 DIAGNOSIS — E559 Vitamin D deficiency, unspecified: Secondary | ICD-10-CM | POA: Diagnosis not present

## 2015-06-20 DIAGNOSIS — K589 Irritable bowel syndrome without diarrhea: Secondary | ICD-10-CM | POA: Diagnosis not present

## 2015-06-20 DIAGNOSIS — Z6824 Body mass index (BMI) 24.0-24.9, adult: Secondary | ICD-10-CM | POA: Diagnosis not present

## 2015-06-20 DIAGNOSIS — L719 Rosacea, unspecified: Secondary | ICD-10-CM | POA: Diagnosis not present

## 2015-06-20 DIAGNOSIS — Z79890 Hormone replacement therapy (postmenopausal): Secondary | ICD-10-CM | POA: Diagnosis not present

## 2015-06-20 DIAGNOSIS — R5381 Other malaise: Secondary | ICD-10-CM | POA: Diagnosis not present

## 2015-08-02 MED FILL — ESTRADIOL 2 MG TABLET: 2 | 90 days supply | Qty: 90 | Fill #2

## 2015-08-02 MED FILL — FENOFIBRATE 160 MG TABLET: 160 | 90 days supply | Qty: 90 | Fill #1

## 2015-11-08 MED FILL — ESTRADIOL 2 MG TABLET: 2 | 90 days supply | Qty: 90 | Fill #3

## 2015-11-08 MED FILL — FENOFIBRATE 160 MG TABLET: 160 | 90 days supply | Qty: 90 | Fill #2

## 2016-01-31 MED FILL — ESTRADIOL 2 MG TABLET: 2 | 90 days supply | Qty: 90 | Fill #0

## 2016-01-31 MED FILL — FENOFIBRATE 160 MG TABLET: 160 | 90 days supply | Qty: 90 | Fill #3

## 2016-02-14 DIAGNOSIS — R194 Change in bowel habit: Secondary | ICD-10-CM | POA: Diagnosis not present

## 2016-02-14 DIAGNOSIS — Z1211 Encounter for screening for malignant neoplasm of colon: Secondary | ICD-10-CM | POA: Diagnosis not present

## 2016-02-14 DIAGNOSIS — R140 Abdominal distension (gaseous): Secondary | ICD-10-CM | POA: Diagnosis not present

## 2016-02-18 MED FILL — GAVILYTE-G SOLUTION: 236 | 1 days supply | Qty: 4000 | Fill #0

## 2016-02-22 DIAGNOSIS — Z1211 Encounter for screening for malignant neoplasm of colon: Secondary | ICD-10-CM | POA: Diagnosis not present

## 2016-03-17 MED FILL — RESTORA CAPSULE: 30 days supply | Qty: 30 | Fill #0

## 2016-03-17 MED FILL — LINZESS 290 MCG CAPSULE: 290 | 90 days supply | Qty: 90 | Fill #0

## 2016-05-08 MED FILL — FENOFIBRATE 160 MG TABLET: 160 | 90 days supply | Qty: 90 | Fill #0

## 2016-05-08 MED FILL — ESTRADIOL 2 MG TABLET: 2 | 90 days supply | Qty: 90 | Fill #1

## 2016-05-28 DIAGNOSIS — L718 Other rosacea: Secondary | ICD-10-CM | POA: Diagnosis not present

## 2016-06-06 MED FILL — DOXYCYCLINE HYCLATE 100 MG: 100 | 30 days supply | Qty: 60 | Fill #0

## 2016-06-06 MED FILL — metroNIDAZOLE 0.75 % CREA: 0.75 | 30 days supply | Qty: 45 | Fill #0

## 2016-06-06 MED FILL — RHOFADE 1% CREAM: 1 | 30 days supply | Qty: 30 | Fill #0

## 2016-06-18 DIAGNOSIS — E559 Vitamin D deficiency, unspecified: Secondary | ICD-10-CM | POA: Diagnosis not present

## 2016-06-18 DIAGNOSIS — Z0000 Encounter for general adult medical examination without abnormal findings: Secondary | ICD-10-CM | POA: Diagnosis not present

## 2016-06-19 DIAGNOSIS — Z1212 Encounter for screening for malignant neoplasm of rectum: Secondary | ICD-10-CM | POA: Diagnosis not present

## 2016-06-23 ENCOUNTER — Other Ambulatory Visit (HOSPITAL_BASED_OUTPATIENT_CLINIC_OR_DEPARTMENT_OTHER): Payer: Self-pay | Admitting: Internal Medicine

## 2016-06-23 DIAGNOSIS — Z1239 Encounter for other screening for malignant neoplasm of breast: Secondary | ICD-10-CM

## 2016-06-24 ENCOUNTER — Ambulatory Visit (HOSPITAL_BASED_OUTPATIENT_CLINIC_OR_DEPARTMENT_OTHER)
Admission: RE | Admit: 2016-06-24 | Discharge: 2016-06-24 | Disposition: A | Discharge status: Home / Self Care | Payer: 59 | Source: Ambulatory Visit | Attending: Internal Medicine | Admitting: Internal Medicine

## 2016-06-24 ENCOUNTER — Encounter (HOSPITAL_BASED_OUTPATIENT_CLINIC_OR_DEPARTMENT_OTHER): Payer: Self-pay

## 2016-06-24 DIAGNOSIS — Z1231 Encounter for screening mammogram for malignant neoplasm of breast: Secondary | ICD-10-CM | POA: Diagnosis not present

## 2016-06-24 DIAGNOSIS — Z1239 Encounter for other screening for malignant neoplasm of breast: Secondary | ICD-10-CM

## 2016-06-25 DIAGNOSIS — M859 Disorder of bone density and structure, unspecified: Secondary | ICD-10-CM | POA: Diagnosis not present

## 2016-06-25 DIAGNOSIS — L719 Rosacea, unspecified: Secondary | ICD-10-CM | POA: Diagnosis not present

## 2016-06-25 DIAGNOSIS — R413 Other amnesia: Secondary | ICD-10-CM | POA: Diagnosis not present

## 2016-06-25 DIAGNOSIS — Z0000 Encounter for general adult medical examination without abnormal findings: Secondary | ICD-10-CM | POA: Diagnosis not present

## 2016-06-25 DIAGNOSIS — E781 Pure hyperglyceridemia: Secondary | ICD-10-CM | POA: Diagnosis not present

## 2016-06-25 DIAGNOSIS — E559 Vitamin D deficiency, unspecified: Secondary | ICD-10-CM | POA: Diagnosis not present

## 2016-06-25 DIAGNOSIS — Z79890 Hormone replacement therapy (postmenopausal): Secondary | ICD-10-CM | POA: Diagnosis not present

## 2016-06-25 DIAGNOSIS — Z1389 Encounter for screening for other disorder: Secondary | ICD-10-CM | POA: Diagnosis not present

## 2016-06-25 DIAGNOSIS — Z8249 Family history of ischemic heart disease and other diseases of the circulatory system: Secondary | ICD-10-CM | POA: Diagnosis not present

## 2016-06-25 DIAGNOSIS — K589 Irritable bowel syndrome without diarrhea: Secondary | ICD-10-CM | POA: Diagnosis not present

## 2016-08-14 MED FILL — ESTRADIOL 2 MG TABLET: 2 | 90 days supply | Qty: 90 | Fill #2

## 2016-08-14 MED FILL — FENOFIBRATE 160 MG TABLET: 160 | 90 days supply | Qty: 90 | Fill #0

## 2016-11-24 MED FILL — ESTRADIOL 2 MG TABLET: 2 | 90 days supply | Qty: 90 | Fill #3

## 2016-11-24 MED FILL — FENOFIBRATE 160 MG TABLET: 160 | 90 days supply | Qty: 90 | Fill #0

## 2017-01-13 DIAGNOSIS — H524 Presbyopia: Secondary | ICD-10-CM | POA: Diagnosis not present

## 2017-02-16 MED FILL — LINZESS 290 MCG CAPSULE: 290 | 90 days supply | Qty: 90 | Fill #1

## 2017-03-02 MED FILL — ESTRADIOL 2 MG TABLET: 2 | 90 days supply | Qty: 90 | Fill #0

## 2017-03-12 MED FILL — FENOFIBRATE 160 MG TABLET: 160 | 90 days supply | Qty: 90 | Fill #0

## 2017-04-11 ENCOUNTER — Telehealth: Payer: 59 | Admitting: Physician Assistant

## 2017-04-11 DIAGNOSIS — B9689 Other specified bacterial agents as the cause of diseases classified elsewhere: Secondary | ICD-10-CM

## 2017-04-11 DIAGNOSIS — J208 Acute bronchitis due to other specified organisms: Secondary | ICD-10-CM | POA: Diagnosis not present

## 2017-04-11 MED ORDER — BENZONATATE 100 MG PO CAPS: 100.0000 mg | ORAL_CAPSULE | Freq: Three times a day (TID) | ORAL | 0 refills | Status: DC | PRN

## 2017-04-11 MED ORDER — DOXYCYCLINE HYCLATE 100 MG PO CAPS
100.0000 mg | ORAL_CAPSULE | Freq: Two times a day (BID) | ORAL | 0 refills | Status: DC
Start: 2017-04-11 — End: 2019-03-02

## 2017-04-11 NOTE — Progress Notes (Signed)

## 2017-04-14 MED FILL — DOXYCYCLINE HYCLATE 100 MG: 100 | 30 days supply | Qty: 60 | Fill #1

## 2017-06-05 MED FILL — FENOFIBRATE 160 MG TABLET: 160 | 90 days supply | Qty: 90 | Fill #1

## 2017-06-05 MED FILL — ESTRADIOL 2 MG TABLET: 2 | 90 days supply | Qty: 90 | Fill #1

## 2017-06-23 ENCOUNTER — Inpatient Hospital Stay (HOSPITAL_BASED_OUTPATIENT_CLINIC_OR_DEPARTMENT_OTHER): Admission: RE | Admit: 2017-06-23 | Payer: 59 | Source: Ambulatory Visit

## 2017-06-23 ENCOUNTER — Other Ambulatory Visit (HOSPITAL_BASED_OUTPATIENT_CLINIC_OR_DEPARTMENT_OTHER): Payer: Self-pay | Admitting: Internal Medicine

## 2017-06-23 DIAGNOSIS — Z1239 Encounter for other screening for malignant neoplasm of breast: Secondary | ICD-10-CM

## 2017-06-24 DIAGNOSIS — Z0000 Encounter for general adult medical examination without abnormal findings: Secondary | ICD-10-CM | POA: Diagnosis not present

## 2017-06-24 DIAGNOSIS — M859 Disorder of bone density and structure, unspecified: Secondary | ICD-10-CM | POA: Diagnosis not present

## 2017-06-24 DIAGNOSIS — R82998 Other abnormal findings in urine: Secondary | ICD-10-CM | POA: Diagnosis not present

## 2017-07-03 ENCOUNTER — Ambulatory Visit (HOSPITAL_BASED_OUTPATIENT_CLINIC_OR_DEPARTMENT_OTHER)
Admission: RE | Admit: 2017-07-03 | Discharge: 2017-07-03 | Disposition: A | Discharge status: Home / Self Care | Payer: 59 | Source: Ambulatory Visit | Attending: Internal Medicine | Admitting: Internal Medicine

## 2017-07-03 DIAGNOSIS — Z1231 Encounter for screening mammogram for malignant neoplasm of breast: Secondary | ICD-10-CM | POA: Diagnosis not present

## 2017-07-03 DIAGNOSIS — Z1239 Encounter for other screening for malignant neoplasm of breast: Secondary | ICD-10-CM

## 2017-07-24 DIAGNOSIS — Z1212 Encounter for screening for malignant neoplasm of rectum: Secondary | ICD-10-CM | POA: Diagnosis not present

## 2017-09-16 DIAGNOSIS — M859 Disorder of bone density and structure, unspecified: Secondary | ICD-10-CM | POA: Diagnosis not present

## 2017-09-16 DIAGNOSIS — L718 Other rosacea: Secondary | ICD-10-CM | POA: Diagnosis not present

## 2017-09-16 DIAGNOSIS — R413 Other amnesia: Secondary | ICD-10-CM | POA: Diagnosis not present

## 2017-09-16 DIAGNOSIS — Z8249 Family history of ischemic heart disease and other diseases of the circulatory system: Secondary | ICD-10-CM | POA: Diagnosis not present

## 2017-09-16 DIAGNOSIS — Z79890 Hormone replacement therapy (postmenopausal): Secondary | ICD-10-CM | POA: Diagnosis not present

## 2017-09-16 DIAGNOSIS — E559 Vitamin D deficiency, unspecified: Secondary | ICD-10-CM | POA: Diagnosis not present

## 2017-09-16 DIAGNOSIS — K589 Irritable bowel syndrome without diarrhea: Secondary | ICD-10-CM | POA: Diagnosis not present

## 2017-09-16 DIAGNOSIS — Z1389 Encounter for screening for other disorder: Secondary | ICD-10-CM | POA: Diagnosis not present

## 2017-09-16 DIAGNOSIS — Z0000 Encounter for general adult medical examination without abnormal findings: Secondary | ICD-10-CM | POA: Diagnosis not present

## 2017-09-16 DIAGNOSIS — E781 Pure hyperglyceridemia: Secondary | ICD-10-CM | POA: Diagnosis not present

## 2017-09-16 MED FILL — FENOFIBRATE 160 MG TABLET: 160 | 90 days supply | Qty: 90 | Fill #0

## 2017-10-12 MED FILL — ESTRADIOL 2 MG TABLET: 2 | 90 days supply | Qty: 90 | Fill #2

## 2018-01-14 MED FILL — FENOFIBRATE 160 MG TABLET: 160 | 90 days supply | Qty: 90 | Fill #1

## 2018-01-14 MED FILL — ESTRADIOL 2 MG TABLET: 2 | 90 days supply | Qty: 90 | Fill #3

## 2018-02-03 DIAGNOSIS — H524 Presbyopia: Secondary | ICD-10-CM | POA: Diagnosis not present

## 2018-03-03 HISTORY — PX: BREAST EXCISIONAL BIOPSY: SUR124

## 2018-03-18 DIAGNOSIS — K5904 Chronic idiopathic constipation: Secondary | ICD-10-CM | POA: Diagnosis not present

## 2018-03-18 DIAGNOSIS — R140 Abdominal distension (gaseous): Secondary | ICD-10-CM | POA: Diagnosis not present

## 2018-05-10 MED FILL — ESTRADIOL 2 MG TABS: 2 | 90 days supply | Qty: 90 | Fill #0

## 2018-08-30 MED FILL — FENOFIBRATE 160 MG TABLET: 160 | 90 days supply | Qty: 90 | Fill #2

## 2018-08-30 MED FILL — ESTRADIOL 2 MG TABS: 2 | 90 days supply | Qty: 90 | Fill #1

## 2018-09-15 DIAGNOSIS — Z0000 Encounter for general adult medical examination without abnormal findings: Secondary | ICD-10-CM | POA: Diagnosis not present

## 2018-09-15 DIAGNOSIS — R82998 Other abnormal findings in urine: Secondary | ICD-10-CM | POA: Diagnosis not present

## 2018-09-15 DIAGNOSIS — M859 Disorder of bone density and structure, unspecified: Secondary | ICD-10-CM | POA: Diagnosis not present

## 2018-09-20 DIAGNOSIS — Z79890 Hormone replacement therapy (postmenopausal): Secondary | ICD-10-CM | POA: Diagnosis not present

## 2018-09-20 DIAGNOSIS — K589 Irritable bowel syndrome without diarrhea: Secondary | ICD-10-CM | POA: Diagnosis not present

## 2018-09-20 DIAGNOSIS — Z1331 Encounter for screening for depression: Secondary | ICD-10-CM | POA: Diagnosis not present

## 2018-09-20 DIAGNOSIS — E781 Pure hyperglyceridemia: Secondary | ICD-10-CM | POA: Diagnosis not present

## 2018-09-20 DIAGNOSIS — E559 Vitamin D deficiency, unspecified: Secondary | ICD-10-CM | POA: Diagnosis not present

## 2018-09-20 DIAGNOSIS — Z0000 Encounter for general adult medical examination without abnormal findings: Secondary | ICD-10-CM | POA: Diagnosis not present

## 2018-09-20 DIAGNOSIS — Z1339 Encounter for screening examination for other mental health and behavioral disorders: Secondary | ICD-10-CM | POA: Diagnosis not present

## 2018-09-20 DIAGNOSIS — M8580 Other specified disorders of bone density and structure, unspecified site: Secondary | ICD-10-CM | POA: Diagnosis not present

## 2018-09-20 DIAGNOSIS — Z8249 Family history of ischemic heart disease and other diseases of the circulatory system: Secondary | ICD-10-CM | POA: Diagnosis not present

## 2018-10-12 ENCOUNTER — Other Ambulatory Visit (HOSPITAL_BASED_OUTPATIENT_CLINIC_OR_DEPARTMENT_OTHER): Payer: Self-pay | Admitting: Internal Medicine

## 2018-10-12 DIAGNOSIS — Z1239 Encounter for other screening for malignant neoplasm of breast: Secondary | ICD-10-CM

## 2018-10-25 ENCOUNTER — Ambulatory Visit (HOSPITAL_BASED_OUTPATIENT_CLINIC_OR_DEPARTMENT_OTHER)
Admission: RE | Admit: 2018-10-25 | Discharge: 2018-10-25 | Disposition: A | Discharge status: Home / Self Care | Payer: 59 | Source: Ambulatory Visit | Attending: Internal Medicine | Admitting: Internal Medicine

## 2018-10-25 ENCOUNTER — Other Ambulatory Visit: Payer: Self-pay

## 2018-10-25 DIAGNOSIS — Z1231 Encounter for screening mammogram for malignant neoplasm of breast: Secondary | ICD-10-CM | POA: Diagnosis not present

## 2018-10-25 DIAGNOSIS — Z1239 Encounter for other screening for malignant neoplasm of breast: Secondary | ICD-10-CM

## 2018-10-27 ENCOUNTER — Other Ambulatory Visit: Payer: Self-pay | Admitting: Internal Medicine

## 2018-10-27 DIAGNOSIS — R928 Other abnormal and inconclusive findings on diagnostic imaging of breast: Secondary | ICD-10-CM

## 2018-11-02 ENCOUNTER — Other Ambulatory Visit: Payer: Self-pay

## 2018-11-02 ENCOUNTER — Other Ambulatory Visit: Payer: Self-pay | Admitting: Internal Medicine

## 2018-11-02 ENCOUNTER — Ambulatory Visit
Admission: RE | Admit: 2018-11-02 | Discharge: 2018-11-02 | Disposition: A | Discharge status: Home / Self Care | Payer: 59 | Source: Ambulatory Visit | Attending: Internal Medicine | Admitting: Internal Medicine

## 2018-11-02 DIAGNOSIS — R922 Inconclusive mammogram: Secondary | ICD-10-CM | POA: Diagnosis not present

## 2018-11-02 DIAGNOSIS — R928 Other abnormal and inconclusive findings on diagnostic imaging of breast: Secondary | ICD-10-CM

## 2018-11-04 ENCOUNTER — Ambulatory Visit
Admission: RE | Admit: 2018-11-04 | Discharge: 2018-11-04 | Disposition: A | Discharge status: Home / Self Care | Payer: 59 | Source: Ambulatory Visit | Attending: Internal Medicine | Admitting: Internal Medicine

## 2018-11-04 ENCOUNTER — Other Ambulatory Visit: Payer: Self-pay

## 2018-11-04 DIAGNOSIS — N6012 Diffuse cystic mastopathy of left breast: Secondary | ICD-10-CM | POA: Diagnosis not present

## 2018-11-04 DIAGNOSIS — R928 Other abnormal and inconclusive findings on diagnostic imaging of breast: Secondary | ICD-10-CM

## 2018-11-04 DIAGNOSIS — R921 Mammographic calcification found on diagnostic imaging of breast: Secondary | ICD-10-CM | POA: Diagnosis not present

## 2018-12-01 DIAGNOSIS — L905 Scar conditions and fibrosis of skin: Secondary | ICD-10-CM | POA: Diagnosis not present

## 2019-02-10 MED FILL — FENOFIBRATE 160 MG TABLET: 160 | 90 days supply | Qty: 90 | Fill #0

## 2019-03-02 ENCOUNTER — Ambulatory Visit
Admission: EM | Admit: 2019-03-02 | Discharge: 2019-03-02 | Disposition: A | Discharge status: Home / Self Care | Payer: 59 | Attending: Physician Assistant | Admitting: Physician Assistant

## 2019-03-02 ENCOUNTER — Other Ambulatory Visit: Payer: Self-pay

## 2019-03-02 ENCOUNTER — Ambulatory Visit (INDEPENDENT_AMBULATORY_CARE_PROVIDER_SITE_OTHER): Payer: 59

## 2019-03-02 DIAGNOSIS — M85852 Other specified disorders of bone density and structure, left thigh: Secondary | ICD-10-CM

## 2019-03-02 DIAGNOSIS — M1990 Unspecified osteoarthritis, unspecified site: Secondary | ICD-10-CM

## 2019-03-02 DIAGNOSIS — M25552 Pain in left hip: Secondary | ICD-10-CM | POA: Diagnosis not present

## 2019-03-02 DIAGNOSIS — M1612 Unilateral primary osteoarthritis, left hip: Secondary | ICD-10-CM | POA: Diagnosis not present

## 2019-03-02 NOTE — Discharge Instructions (Signed)
Xray shows osteopenia and degenerative changes. Continue to monitor, can do short course of naproxen if needed. Follow up with PCP for further evaluation if symptoms continue.

## 2019-03-02 NOTE — ED Triage Notes (Signed)
Pt c/o generalized bone pain for the past few weeks. States unable to sleep d/t pain. Denies any URI sx's. Denies injury

## 2019-03-02 NOTE — ED Provider Notes (Signed)
[facility]    CSN: [id number] Arrival date & time: 03/02/19  [id number]      History   Chief Complaint Chief Complaint  Patient presents with  . Generalized Body Aches    HPI Katie Ho is a 58 y.o. female.   58 year old female with history of osteopenia, vitamin D deficiency, hypertriglyceridemia comes in for few month history of MSK pain. Patient states first noticed it 10/2018 with left thoracic back pain that resolved with naproxen, for which she though was due to muscle strain. In the past few months, she has had pinpoint pain to the thoracic back, left hip, left 5th finger. Denies injury/trauma. These pain are usually mild and intermittent, not requiring her to take medications for it. However, she was found to have abnormal breast mass to the left breast requiring biopsy 11/2018. States most came back benign, with one having radial scarring. She has a lumpectomy scheduled 05/2019. Given this history, she would like to make sure pain is not caused by bone lesions.   She denies any other symptoms such as URI symptoms, shortness of breath. Denies fever, chills, abdominal pain, nausea, vomiting. Denies urinary changes. Last physical 09/30/2018 with normal blood work except for slightly decreased protein, for which she was told to consume more meat. Has history of vitamin D deficiency on 10,000units daily, which has not changed for the past 2 years.      Past Medical History:  Diagnosis Date  . Hyperlipidemia     Patient Active Problem List   Diagnosis Date Noted  . Right shoulder pain 04/05/2015  . Cystocele 03/12/2013  . S/P total hysterectomy and BSO (bilateral salpingo-oophorectomy) 03/12/2013  . Vitamin D deficiency 03/12/2013  . LGSIL (low grade squamous intraepithelial dysplasia) 03/10/2013  . Family history of breast cancer 06/23/2012  . Hypertriglyceridemia 06/22/2012  . Rosacea 06/22/2012  . Osteopenia 06/22/2012  . Stress incontinence 06/22/2012     Past Surgical History:  Procedure Laterality Date  . ABDOMINAL HYSTERECTOMY    . CESAREAN SECTION      OB History    Gravida  5   Para  4   Term      Preterm      AB  1   Living  3     SAB  1   TAB      Ectopic      Multiple      Live Births               Home Medications    Prior to Admission medications   Medication Sig Start Date End Date Taking? Authorizing Provider  Vitamin D, Ergocalciferol, (DRISDOL) 50000 UNITS CAPS capsule Take 1 capsule (50,000 Units total) by mouth every 7 (seven) days. 03/09/14  Yes Schoenhoff, Harrington Challengereborah D, MD  fenofibrate 160 MG tablet TAKE 1 TABLET BY MOUTH ONCE DAILY 03/27/14   Schoenhoff, Harrington Challengereborah D, MD    Family History Family History  Problem Relation Age of Onset  . Heart disease Father   . Cancer Maternal Aunt 40       breast  . Mental illness Maternal Grandmother   . Cancer Maternal Grandfather        lung  . Cancer Paternal Grandmother 3080       breast    Social History Social History   Tobacco Use  . Smoking status: Never Smoker  . Smokeless tobacco: Never Used  Substance Use Topics  . Alcohol use: No  . Drug  use: No     Allergies   Patient has no known allergies.   Review of Systems Review of Systems  Reason unable to perform ROS: See HPI as above.     Physical Exam Triage Vital Signs ED Triage Vitals [03/02/19 0959]  Enc Vitals Group     BP (!) 147/83     Pulse Rate 82     Resp 16     Temp 98.2 F (36.8 C)     Temp Source Oral     SpO2 98 %     Weight      Height      Head Circumference      Peak Flow      Pain Score 0     Pain Loc      Pain Edu?      Excl. in [place]?    No data found.  Updated Vital Signs BP (!) 147/83 (BP Location: Left Arm)   Pulse 82   Temp 98.2 F (36.8 C) (Oral)   Resp 16   SpO2 98%   Physical Exam Constitutional:      General: She is not in acute distress.    Appearance: She is well-developed. She is not diaphoretic.  HENT:     Head:  Normocephalic and atraumatic.  Eyes:     Conjunctiva/sclera: Conjunctivae normal.     Pupils: Pupils are equal, round, and reactive to light.  Pulmonary:     Effort: Pulmonary effort is normal. No respiratory distress.  Musculoskeletal:     Comments: No deformity, swelling noted. Mild tenderness to palpation of thoracic midline approx to T7-8 region. Tenderness to palpation along left lumbar/sacral region, along iliac crest. Point tenderness to palpation of left posterolateral hip. Full ROM of back, hips. Strength normal and equal bilaterally. Sensation intact. Normal gait.   Skin:    General: Skin is warm and dry.  Neurological:     Mental Status: She is alert and oriented to person, place, and time.    UC Treatments / Results  Labs (all labs ordered are listed, but only abnormal results are displayed) Labs Reviewed - No data to display  EKG   Radiology DG Hip Unilat With Pelvis 2-3 Views Left  Result Date: 03/02/2019 CLINICAL DATA:  Generalized bone pain for past few weeks, expect Shiley left hip. EXAM: DG HIP (WITH OR WITHOUT PELVIS) 2-3V LEFT COMPARISON:  CT abdomen and pelvis from 2011 FINDINGS: Signs of degenerative changes in the hips bilaterally.  Osteopenia. No signs of acute bone finding, fracture or subluxation. IMPRESSION: Osteopenia and degenerative changes without signs of fracture or dislocation. Electronically Signed   By: Zetta Bills M.D.   On: 03/02/2019 10:38    Procedures Procedures (including critical care time)  Medications Ordered in UC Medications - No data to display  Initial Impression / Assessment and Plan / UC Course  I have reviewed the triage vital signs and the nursing notes.  Pertinent labs & imaging results that were available during my care of the patient were reviewed by me and considered in my medical decision making (see chart for details).    Xray shows degenrative changes and osteopenia. Patient stopped external estrogen 11/2018 after  abnormal mammogram. ? If symptoms could be due to decrease in estrogen. At this time will treat for arthritis flare with naproxen course. Follow up with PCP for further evaluation if symptoms not improving. Return precautions given. Patient expresses understanding and agrees to plan.  Final Clinical Impressions(s) /  UC Diagnoses   Final diagnoses:  Left hip pain   ED Prescriptions    None     PDMP not reviewed this encounter.   Belinda Fisher, PA-C 03/02/19 1109

## 2019-04-07 ENCOUNTER — Other Ambulatory Visit: Payer: Self-pay | Admitting: General Surgery

## 2019-04-07 DIAGNOSIS — N6489 Other specified disorders of breast: Secondary | ICD-10-CM

## 2019-05-11 ENCOUNTER — Other Ambulatory Visit: Payer: Self-pay

## 2019-05-11 ENCOUNTER — Ambulatory Visit
Admission: RE | Admit: 2019-05-11 | Discharge: 2019-05-11 | Disposition: A | Discharge status: Home / Self Care | Payer: 59 | Source: Ambulatory Visit | Attending: General Surgery | Admitting: General Surgery

## 2019-05-11 DIAGNOSIS — R921 Mammographic calcification found on diagnostic imaging of breast: Secondary | ICD-10-CM | POA: Diagnosis not present

## 2019-05-11 DIAGNOSIS — N6489 Other specified disorders of breast: Secondary | ICD-10-CM

## 2019-05-13 DIAGNOSIS — N6489 Other specified disorders of breast: Secondary | ICD-10-CM | POA: Diagnosis not present

## 2019-05-15 ENCOUNTER — Other Ambulatory Visit: Payer: Self-pay | Admitting: General Surgery

## 2019-05-15 DIAGNOSIS — N6489 Other specified disorders of breast: Secondary | ICD-10-CM

## 2019-05-17 ENCOUNTER — Other Ambulatory Visit: Payer: Self-pay | Admitting: General Surgery

## 2019-05-17 DIAGNOSIS — N6489 Other specified disorders of breast: Secondary | ICD-10-CM

## 2019-05-25 ENCOUNTER — Emergency Department (HOSPITAL_COMMUNITY): Payer: 59

## 2019-05-25 ENCOUNTER — Emergency Department (HOSPITAL_COMMUNITY)
Admission: EM | Admit: 2019-05-25 | Discharge: 2019-05-25 | Disposition: A | Discharge status: Home / Self Care | Payer: 59 | Attending: Emergency Medicine | Admitting: Emergency Medicine

## 2019-05-25 ENCOUNTER — Other Ambulatory Visit: Payer: Self-pay

## 2019-05-25 ENCOUNTER — Encounter (HOSPITAL_COMMUNITY): Payer: Self-pay | Admitting: Emergency Medicine

## 2019-05-25 DIAGNOSIS — R1902 Left upper quadrant abdominal swelling, mass and lump: Secondary | ICD-10-CM | POA: Diagnosis not present

## 2019-05-25 DIAGNOSIS — R1900 Intra-abdominal and pelvic swelling, mass and lump, unspecified site: Secondary | ICD-10-CM | POA: Diagnosis not present

## 2019-05-25 DIAGNOSIS — R222 Localized swelling, mass and lump, trunk: Secondary | ICD-10-CM | POA: Diagnosis not present

## 2019-05-25 DIAGNOSIS — R0789 Other chest pain: Secondary | ICD-10-CM | POA: Diagnosis present

## 2019-05-25 DIAGNOSIS — K8689 Other specified diseases of pancreas: Secondary | ICD-10-CM | POA: Diagnosis not present

## 2019-05-25 LAB — CBC WITH DIFFERENTIAL/PLATELET
Abs Immature Granulocytes: 0.01 10*3/uL (ref 0.00–0.07)
Basophils Absolute: 0.1 10*3/uL (ref 0.0–0.1)
Basophils Relative: 1 %
Eosinophils Absolute: 0.1 10*3/uL (ref 0.0–0.5)
Eosinophils Relative: 2 %
HCT: 39.3 % (ref 36.0–46.0)
Hemoglobin: 13.3 g/dL (ref 12.0–15.0)
Immature Granulocytes: 0 %
Lymphocytes Relative: 47 %
Lymphs Abs: 2.9 10*3/uL (ref 0.7–4.0)
MCH: 31.3 pg (ref 26.0–34.0)
MCHC: 33.8 g/dL (ref 30.0–36.0)
MCV: 92.5 fL (ref 80.0–100.0)
Monocytes Absolute: 0.5 10*3/uL (ref 0.1–1.0)
Monocytes Relative: 8 %
Neutro Abs: 2.7 10*3/uL (ref 1.7–7.7)
Neutrophils Relative %: 42 %
Platelets: 285 10*3/uL (ref 150–400)
RBC: 4.25 MIL/uL (ref 3.87–5.11)
RDW: 12.6 % (ref 11.5–15.5)
WBC: 6.2 10*3/uL (ref 4.0–10.5)
nRBC: 0 % (ref 0.0–0.2)

## 2019-05-25 LAB — COMPREHENSIVE METABOLIC PANEL
ALT: 24 U/L (ref 0–44)
AST: 23 U/L (ref 15–41)
Albumin: 3.9 g/dL (ref 3.5–5.0)
Alkaline Phosphatase: 82 U/L (ref 38–126)
Anion gap: 7 (ref 5–15)
BUN: 17 mg/dL (ref 6–20)
CO2: 26 mmol/L (ref 22–32)
Calcium: 9.1 mg/dL (ref 8.9–10.3)
Chloride: 107 mmol/L (ref 98–111)
Creatinine, Ser: 0.7 mg/dL (ref 0.44–1.00)
GFR calc Af Amer: >60 mL/min (ref >60)
GFR calc non Af Amer: >60 mL/min (ref >60)
Glucose, Bld: 108 mg/dL — ABNORMAL HIGH (ref 70–99)
Potassium: 3.4 mmol/L — ABNORMAL LOW (ref 3.5–5.1)
Sodium: 140 mmol/L (ref 135–145)
Total Bilirubin: 0.4 mg/dL (ref 0.3–1.2)
Total Protein: 6.9 g/dL (ref 6.5–8.1)

## 2019-05-25 LAB — LIPASE, BLOOD: Lipase: 12 U/L (ref 11–51)

## 2019-05-25 MED ORDER — IOHEXOL 300 MG/ML  SOLN
100.0000 mL | Freq: Once | INTRAMUSCULAR | Status: AC | PRN
  Administered 2019-05-25: 100 mL via INTRAVENOUS

## 2019-05-25 NOTE — ED Triage Notes (Signed)
Pt complains of a mass on her left rib cage that has grown from the size of a grape to a baseball.

## 2019-05-25 NOTE — ED Provider Notes (Signed)
[facility] EMERGENCY DEPARTMENT Provider Note   CSN: [id number] Arrival date & time: 05/25/19  1344     History Chief Complaint  Patient presents with  . rib pain    Katie Ho is a 59 y.o. female.  Pt complains of a swollen painful area left abdomen.  Pt reports it started out as a small area and has increased in size.  Pt reports increased pain today.    The history is provided by the patient. No language interpreter was used.  Abdominal Pain Pain location:  LUQ Pain quality: aching   Pain radiates to:  Does not radiate Pain severity:  Moderate Onset quality:  Gradual Timing:  Constant Progression:  Worsening Chronicity:  New Context: not sick contacts   Relieved by:  Nothing Worsened by:  Nothing Ineffective treatments:  None tried Associated symptoms: no cough, no fever, no hematuria, no shortness of breath and no vomiting       Past Medical History:  Diagnosis Date  . Hyperlipidemia   . Hyperlipidemia     Patient Active Problem List   Diagnosis Date Noted  . Right shoulder pain 04/05/2015  . Cystocele 03/12/2013  . S/P total hysterectomy and BSO (bilateral salpingo-oophorectomy) 03/12/2013  . Vitamin D deficiency 03/12/2013  . LGSIL (low grade squamous intraepithelial dysplasia) 03/10/2013  . Family history of breast cancer 06/23/2012  . Hypertriglyceridemia 06/22/2012  . Rosacea 06/22/2012  . Osteopenia 06/22/2012  . Stress incontinence 06/22/2012    Past Surgical History:  Procedure Laterality Date  . ABDOMINAL HYSTERECTOMY    . BREAST EXCISIONAL BIOPSY Left 2020  . CESAREAN SECTION       OB History    Gravida  5   Para  4   Term      Preterm      AB  1   Living  3     SAB  1   TAB      Ectopic      Multiple      Live Births              Family History  Problem Relation Age of Onset  . Heart disease Father   . Cancer Maternal Aunt 40       breast  . Mental illness Maternal Grandmother   . Cancer  Maternal Grandfather        lung  . Cancer Paternal Grandmother 64       breast    Social History   Tobacco Use  . Smoking status: Never Smoker  . Smokeless tobacco: Never Used  Substance Use Topics  . Alcohol use: No  . Drug use: No    Home Medications Prior to Admission medications   Medication Sig Start Date End Date Taking? Authorizing Provider  Cholecalciferol (VITAMIN D3) 125 MCG (5000 UT) CAPS Take 2 capsules by mouth daily.   Yes [provider]  fenofibrate 160 MG tablet TAKE 1 TABLET BY MOUTH ONCE DAILY 03/27/14  Yes Schoenhoff, Harrington Challenger, MD    Allergies    Patient has no known allergies.  Review of Systems   Review of Systems  Constitutional: Negative for fever.  Respiratory: Negative for cough and shortness of breath.   Gastrointestinal: Positive for abdominal pain. Negative for vomiting.  Genitourinary: Negative for hematuria.  Neurological: Negative for dizziness.    Physical Exam Updated Vital Signs BP 139/79   Pulse 84   Temp 98.2 F (36.8 C) (Oral)   Resp 20  Ht 5\' 2"  (1.575 m)   Wt 64.4 kg   SpO2 100%   BMI 25.97 kg/m   Physical Exam Vitals and nursing note reviewed.  Constitutional:      Appearance: She is well-developed.  HENT:     Head: Normocephalic.     Right Ear: Tympanic membrane normal.     Left Ear: Tympanic membrane normal.     Mouth/Throat:     Mouth: Mucous membranes are moist.  Eyes:     Pupils: Pupils are equal, round, and reactive to light.  Cardiovascular:     Rate and Rhythm: Normal rate.  Pulmonary:     Effort: Pulmonary effort is normal.  Abdominal:     General: Abdomen is flat. There is no distension.     Palpations: There is mass.     Tenderness: There is abdominal tenderness.     Comments: Palpable area of firmness left upper abdominal area, feels like thickening external to abdominal cavity  Tender to palpation.   Musculoskeletal:        General: Normal range of motion.     Cervical back: Normal  range of motion.  Skin:    General: Skin is warm.  Neurological:     General: No focal deficit present.     Mental Status: She is alert and oriented to person, place, and time.  Psychiatric:        Mood and Affect: Mood normal.     ED Results / Procedures / Treatments   Labs (all labs ordered are listed, but only abnormal results are displayed) Labs Reviewed  COMPREHENSIVE METABOLIC PANEL - Abnormal; Notable for the following components:      Result Value   Potassium 3.4 (*)    Glucose, Bld 108 (*)    All other components within normal limits  CBC WITH DIFFERENTIAL/PLATELET  LIPASE, BLOOD    EKG None  Radiology DG Ribs Unilateral W/Chest Left  Result Date: 05/25/2019 CLINICAL DATA:  Enlarging left ribcage mass. EXAM: LEFT RIBS AND CHEST - 3+ VIEW COMPARISON:  None. FINDINGS: No fracture or other bone lesions are seen involving the ribs. There is no evidence of pneumothorax or pleural effusion. Both lungs are clear. Heart size and mediastinal contours are within normal limits. IMPRESSION: Negative. Electronically Signed   By: Virgina Norfolk M.D.   On: 05/25/2019 15:45   CT ABDOMEN PELVIS W CONTRAST  Result Date: 05/25/2019 CLINICAL DATA:  Pt complains of a mass on her left rib cage that has grown from the size of a grape to a baseball. Bb placed over area of concern during imaging H/O hysterectomy. EXAM: CT ABDOMEN AND PELVIS WITH CONTRAST TECHNIQUE: Multidetector CT imaging of the abdomen and pelvis was performed using the standard protocol following bolus administration of intravenous contrast. CONTRAST:  125mL OMNIPAQUE IOHEXOL 300 MG/ML  SOLN COMPARISON:  CT, 03/29/2009 FINDINGS: Lower chest: No acute abnormality. Hepatobiliary: No focal liver abnormality is seen. No gallstones, gallbladder wall thickening, or biliary dilatation. Pancreas: Extensive fatty replacement of the pancreas. No mass or inflammation. Spleen: Normal in size without focal abnormality. Adrenals/Urinary  Tract: Adrenal glands are unremarkable. Kidneys are normal, without renal calculi, focal lesion, or hydronephrosis. Bladder is unremarkable. Stomach/Bowel: Stomach is within normal limits. Appendix appears normal. No evidence of bowel wall thickening, distention, or inflammatory changes. Vascular/Lymphatic: No significant vascular findings are present. No enlarged abdominal or pelvic lymph nodes. Reproductive: Status post hysterectomy. No adnexal masses. Other: No soft tissue mass. Specifically, there is no mass or  abnormality involving the left rib cage account for the patient's reported symptoms. No abdominal wall hernia. No ascites. Musculoskeletal: No fracture or acute finding. No osteoblastic or osteolytic lesions. No significant skeletal abnormality. IMPRESSION: 1. No acute findings within the abdomen or pelvis. 2. There is no mass or abnormality to correspond to the patient's reported mass along her left rib cage. The marker corresponding to the palpable abnormality is below the left rib cage, at the level of the left kidney. There is no mass or abnormality in this location either. 3. Advanced fatty replacement of the pancreas, stable. Status post hysterectomy. Electronically Signed   By: Amie Portland M.D.   On: 05/25/2019 19:08    Procedures Procedures (including critical care time)  Medications Ordered in ED Medications  iohexol (OMNIPAQUE) 300 MG/ML solution 100 mL (100 mLs Intravenous Contrast Given 05/25/19 1847)    ED Course  I have reviewed the triage vital signs and the nursing notes.  Pertinent labs & imaging results that were available during my care of the patient were reviewed by me and considered in my medical decision making (see chart for details).    MDM Rules/Calculators/A&P                      MDM: Pt currently being evaluated for breast mass with plan for a biopsy 4/22 by Dr. Dwain Sarna.   Due to significance of pain and possible of abdominal mass a  Ct abdomen was  obtained. Laboratory evaluation are reviewed and normal Ct scan shows no abdominal mass. Pancreas shows significant fatty replacement.  This is reviewed on ct from 2005 and 2011.  Pt is counseled on findings.  I advised follow up with Dr. Wylene Simmer, careful monitoring of glucose levels and continued GI follow up  Final Clinical Impression(s) / ED Diagnoses Final diagnoses:  Abdominal wall swelling    Rx / DC Orders ED Discharge Orders    None    An After Visit Summary was printed and given to the patient.    Elson Areas, [place] 05/26/19 1402    Bethann Berkshire, MD 05/30/19 [phone]

## 2019-05-25 NOTE — Discharge Instructions (Addendum)
Follow up as scheduled.  Have Dr. Wylene Simmer follow up on pancreatic fatty disease.

## 2019-06-15 ENCOUNTER — Encounter (HOSPITAL_BASED_OUTPATIENT_CLINIC_OR_DEPARTMENT_OTHER): Payer: Self-pay | Admitting: General Surgery

## 2019-06-15 ENCOUNTER — Other Ambulatory Visit: Payer: Self-pay

## 2019-06-20 ENCOUNTER — Other Ambulatory Visit: Payer: Self-pay

## 2019-06-20 ENCOUNTER — Other Ambulatory Visit (HOSPITAL_COMMUNITY)
Admission: RE | Admit: 2019-06-20 | Discharge: 2019-06-20 | Disposition: A | Discharge status: Home / Self Care | Payer: 59 | Source: Ambulatory Visit | Attending: General Surgery | Admitting: General Surgery

## 2019-06-20 DIAGNOSIS — Z01812 Encounter for preprocedural laboratory examination: Secondary | ICD-10-CM | POA: Diagnosis not present

## 2019-06-20 DIAGNOSIS — Z20822 Contact with and (suspected) exposure to covid-19: Secondary | ICD-10-CM | POA: Insufficient documentation

## 2019-06-20 LAB — SARS CORONAVIRUS 2 (TAT 6-24 HRS): SARS Coronavirus 2: NEGATIVE

## 2019-06-22 ENCOUNTER — Ambulatory Visit
Admission: RE | Admit: 2019-06-22 | Discharge: 2019-06-22 | Disposition: A | Discharge status: Home / Self Care | Payer: 59 | Source: Ambulatory Visit | Attending: General Surgery | Admitting: General Surgery

## 2019-06-22 ENCOUNTER — Other Ambulatory Visit: Payer: Self-pay

## 2019-06-22 DIAGNOSIS — R921 Mammographic calcification found on diagnostic imaging of breast: Secondary | ICD-10-CM | POA: Diagnosis not present

## 2019-06-22 DIAGNOSIS — N6489 Other specified disorders of breast: Secondary | ICD-10-CM

## 2019-06-22 MED ORDER — ENSURE PRE-SURGERY PO LIQD: 296.0000 mL | Freq: Once | ORAL | Status: DC

## 2019-06-22 NOTE — Progress Notes (Signed)

## 2019-06-23 ENCOUNTER — Ambulatory Visit (HOSPITAL_BASED_OUTPATIENT_CLINIC_OR_DEPARTMENT_OTHER)
Admission: RE | Admit: 2019-06-23 | Discharge: 2019-06-23 | Disposition: A | Discharge status: Home / Self Care | Payer: 59 | Attending: General Surgery | Admitting: General Surgery

## 2019-06-23 ENCOUNTER — Other Ambulatory Visit: Payer: Self-pay

## 2019-06-23 ENCOUNTER — Encounter (HOSPITAL_BASED_OUTPATIENT_CLINIC_OR_DEPARTMENT_OTHER)
Admission: RE | Disposition: A | Discharge status: Home / Self Care | Payer: Self-pay | Source: Home / Self Care | Attending: General Surgery

## 2019-06-23 ENCOUNTER — Ambulatory Visit (HOSPITAL_BASED_OUTPATIENT_CLINIC_OR_DEPARTMENT_OTHER): Payer: 59 | Admitting: Anesthesiology

## 2019-06-23 ENCOUNTER — Ambulatory Visit
Admission: RE | Admit: 2019-06-23 | Discharge: 2019-06-23 | Disposition: A | Discharge status: Home / Self Care | Payer: 59 | Source: Ambulatory Visit | Attending: General Surgery | Admitting: General Surgery

## 2019-06-23 ENCOUNTER — Encounter (HOSPITAL_BASED_OUTPATIENT_CLINIC_OR_DEPARTMENT_OTHER): Payer: Self-pay | Admitting: General Surgery

## 2019-06-23 DIAGNOSIS — R928 Other abnormal and inconclusive findings on diagnostic imaging of breast: Secondary | ICD-10-CM | POA: Diagnosis not present

## 2019-06-23 DIAGNOSIS — Z79899 Other long term (current) drug therapy: Secondary | ICD-10-CM | POA: Insufficient documentation

## 2019-06-23 DIAGNOSIS — N6489 Other specified disorders of breast: Secondary | ICD-10-CM | POA: Diagnosis present

## 2019-06-23 DIAGNOSIS — N6032 Fibrosclerosis of left breast: Secondary | ICD-10-CM | POA: Diagnosis not present

## 2019-06-23 DIAGNOSIS — E785 Hyperlipidemia, unspecified: Secondary | ICD-10-CM | POA: Insufficient documentation

## 2019-06-23 DIAGNOSIS — M8580 Other specified disorders of bone density and structure, unspecified site: Secondary | ICD-10-CM | POA: Diagnosis not present

## 2019-06-23 DIAGNOSIS — N6012 Diffuse cystic mastopathy of left breast: Secondary | ICD-10-CM | POA: Diagnosis not present

## 2019-06-23 DIAGNOSIS — E559 Vitamin D deficiency, unspecified: Secondary | ICD-10-CM | POA: Diagnosis not present

## 2019-06-23 DIAGNOSIS — N6320 Unspecified lump in the left breast, unspecified quadrant: Secondary | ICD-10-CM | POA: Diagnosis not present

## 2019-06-23 HISTORY — DX: Nausea with vomiting, unspecified: R11.2

## 2019-06-23 HISTORY — DX: Other specified postprocedural states: Z98.890

## 2019-06-23 HISTORY — PX: RADIOACTIVE SEED GUIDED EXCISIONAL BREAST BIOPSY: SHX6490

## 2019-06-23 SURGERY — RADIOACTIVE SEED GUIDED BREAST BIOPSY
Anesthesia: General | Site: Breast | Laterality: Left

## 2019-06-23 MED ORDER — ACETAMINOPHEN 500 MG PO TABS
ORAL_TABLET | ORAL | Status: AC
  Filled 2019-06-23: qty 2

## 2019-06-23 MED ORDER — PHENYLEPHRINE HCL (PRESSORS) 10 MG/ML IV SOLN
INTRAVENOUS | Status: DC | PRN
  Administered 2019-06-23 (×3): 80 ug via INTRAVENOUS

## 2019-06-23 MED ORDER — CEFAZOLIN SODIUM-DEXTROSE 2-4 GM/100ML-% IV SOLN
INTRAVENOUS | Status: AC
  Filled 2019-06-23: qty 100

## 2019-06-23 MED ORDER — EPHEDRINE 5 MG/ML INJ
INTRAVENOUS | Status: AC
  Filled 2019-06-23: qty 10

## 2019-06-23 MED ORDER — PROPOFOL 10 MG/ML IV BOLUS
INTRAVENOUS | Status: AC
  Filled 2019-06-23: qty 20

## 2019-06-23 MED ORDER — MIDAZOLAM HCL 2 MG/2ML IJ SOLN
INTRAMUSCULAR | Status: AC
  Filled 2019-06-23: qty 2

## 2019-06-23 MED ORDER — MIDAZOLAM HCL 5 MG/5ML IJ SOLN
INTRAMUSCULAR | Status: DC | PRN
  Administered 2019-06-23 (×2): 1 mg via INTRAVENOUS

## 2019-06-23 MED ORDER — GABAPENTIN 100 MG PO CAPS
100.0000 mg | ORAL_CAPSULE | ORAL | Status: AC
  Administered 2019-06-23: 100 mg via ORAL

## 2019-06-23 MED ORDER — FENTANYL CITRATE (PF) 100 MCG/2ML IJ SOLN: 25.0000 ug | INTRAMUSCULAR | Status: DC | PRN

## 2019-06-23 MED ORDER — LIDOCAINE HCL (CARDIAC) PF 100 MG/5ML IV SOSY
PREFILLED_SYRINGE | INTRAVENOUS | Status: DC | PRN
  Administered 2019-06-23: 70 mg via INTRAVENOUS

## 2019-06-23 MED ORDER — PHENYLEPHRINE 40 MCG/ML (10ML) SYRINGE FOR IV PUSH (FOR BLOOD PRESSURE SUPPORT)
PREFILLED_SYRINGE | INTRAVENOUS | Status: AC
  Filled 2019-06-23: qty 10

## 2019-06-23 MED ORDER — ACETAMINOPHEN 500 MG PO TABS
1000.0000 mg | ORAL_TABLET | ORAL | Status: AC
  Administered 2019-06-23: 09:00:00 1000 mg via ORAL

## 2019-06-23 MED ORDER — KETOROLAC TROMETHAMINE 15 MG/ML IJ SOLN
15.0000 mg | INTRAMUSCULAR | Status: AC
  Administered 2019-06-23: 15 mg via INTRAVENOUS

## 2019-06-23 MED ORDER — BUPIVACAINE HCL (PF) 0.25 % IJ SOLN
INTRAMUSCULAR | Status: DC | PRN
  Administered 2019-06-23: 10 mL

## 2019-06-23 MED ORDER — FENTANYL CITRATE (PF) 100 MCG/2ML IJ SOLN
INTRAMUSCULAR | Status: AC
  Filled 2019-06-23: qty 2

## 2019-06-23 MED ORDER — OXYCODONE HCL 5 MG PO TABS: 5.0000 mg | ORAL_TABLET | Freq: Once | ORAL | Status: DC | PRN

## 2019-06-23 MED ORDER — MIDAZOLAM HCL 2 MG/2ML IJ SOLN: 1.0000 mg | INTRAMUSCULAR | Status: DC | PRN

## 2019-06-23 MED ORDER — ONDANSETRON HCL 4 MG/2ML IJ SOLN
INTRAMUSCULAR | Status: AC
  Filled 2019-06-23: qty 2

## 2019-06-23 MED ORDER — ONDANSETRON HCL 4 MG/2ML IJ SOLN
INTRAMUSCULAR | Status: DC | PRN
  Administered 2019-06-23: 4 mg via INTRAVENOUS

## 2019-06-23 MED ORDER — OXYCODONE HCL 5 MG/5ML PO SOLN: 5.0000 mg | Freq: Once | ORAL | Status: DC | PRN

## 2019-06-23 MED ORDER — DEXAMETHASONE SODIUM PHOSPHATE 4 MG/ML IJ SOLN
INTRAMUSCULAR | Status: DC | PRN
  Administered 2019-06-23: 10 mg via INTRAVENOUS

## 2019-06-23 MED ORDER — FENTANYL CITRATE (PF) 100 MCG/2ML IJ SOLN
INTRAMUSCULAR | Status: DC | PRN
  Administered 2019-06-23: 50 ug via INTRAVENOUS

## 2019-06-23 MED ORDER — LACTATED RINGERS IV SOLN: INTRAVENOUS | Status: DC

## 2019-06-23 MED ORDER — GABAPENTIN 100 MG PO CAPS
ORAL_CAPSULE | ORAL | Status: AC
  Filled 2019-06-23: qty 1

## 2019-06-23 MED ORDER — FENTANYL CITRATE (PF) 100 MCG/2ML IJ SOLN: 50.0000 ug | INTRAMUSCULAR | Status: DC | PRN

## 2019-06-23 MED ORDER — CEFAZOLIN SODIUM-DEXTROSE 2-4 GM/100ML-% IV SOLN
2.0000 g | INTRAVENOUS | Status: AC
  Administered 2019-06-23: 2 g via INTRAVENOUS

## 2019-06-23 MED ORDER — KETOROLAC TROMETHAMINE 15 MG/ML IJ SOLN
INTRAMUSCULAR | Status: AC
  Filled 2019-06-23: qty 1

## 2019-06-23 MED ORDER — PROPOFOL 10 MG/ML IV BOLUS
INTRAVENOUS | Status: DC | PRN
  Administered 2019-06-23: 150 mg via INTRAVENOUS

## 2019-06-23 MED ORDER — LIDOCAINE 2% (20 MG/ML) 5 ML SYRINGE
INTRAMUSCULAR | Status: AC
  Filled 2019-06-23: qty 10

## 2019-06-23 SURGICAL SUPPLY — 42 items
ADH SKN CLS APL DERMABOND .7 (GAUZE/BANDAGES/DRESSINGS) ×1
APL PRP STRL LF DISP 70% ISPRP (MISCELLANEOUS) ×1
BINDER BREAST LRG (GAUZE/BANDAGES/DRESSINGS) ×1 IMPLANT
BLADE SURG 15 STRL LF DISP TIS (BLADE) ×1 IMPLANT
BLADE SURG 15 STRL SS (BLADE) ×2
CHLORAPREP W/TINT 26 (MISCELLANEOUS) ×2 IMPLANT
COVER BACK TABLE 60X90IN (DRAPES) ×2 IMPLANT
COVER MAYO STAND STRL (DRAPES) ×2 IMPLANT
COVER PROBE W GEL 5X96 (DRAPES) ×2 IMPLANT
DERMABOND ADVANCED (GAUZE/BANDAGES/DRESSINGS) ×1
DERMABOND ADVANCED .7 DNX12 (GAUZE/BANDAGES/DRESSINGS) ×1 IMPLANT
DRAPE LAPAROSCOPIC ABDOMINAL (DRAPES) ×2 IMPLANT
DRAPE UTILITY XL STRL (DRAPES) ×2 IMPLANT
ELECT COATED BLADE 2.86 ST (ELECTRODE) ×2 IMPLANT
ELECT REM PT RETURN 9FT ADLT (ELECTROSURGICAL) ×2
ELECTRODE REM PT RTRN 9FT ADLT (ELECTROSURGICAL) ×1 IMPLANT
GLOVE BIO SURGEON STRL SZ7 (GLOVE) ×4 IMPLANT
GLOVE BIOGEL PI IND STRL 6.5 (GLOVE) IMPLANT
GLOVE BIOGEL PI IND STRL 7.5 (GLOVE) ×1 IMPLANT
GLOVE BIOGEL PI INDICATOR 6.5 (GLOVE) ×2
GLOVE BIOGEL PI INDICATOR 7.5 (GLOVE) ×1
GLOVE ECLIPSE 6.5 STRL STRAW (GLOVE) ×1 IMPLANT
GLOVE SURG SS PI 6.5 STRL IVOR (GLOVE) ×1 IMPLANT
GOWN STRL REUS W/ TWL LRG LVL3 (GOWN DISPOSABLE) ×2 IMPLANT
GOWN STRL REUS W/TWL LRG LVL3 (GOWN DISPOSABLE) ×4
KIT MARKER MARGIN INK (KITS) ×2 IMPLANT
NDL HYPO 25X1 1.5 SAFETY (NEEDLE) ×1 IMPLANT
NEEDLE HYPO 25X1 1.5 SAFETY (NEEDLE) ×2 IMPLANT
PENCIL SMOKE EVACUATOR (MISCELLANEOUS) ×2 IMPLANT
SET BASIN DAY SURGERY F.S. (CUSTOM PROCEDURE TRAY) ×2 IMPLANT
SLEEVE SCD COMPRESS KNEE MED (MISCELLANEOUS) ×2 IMPLANT
SPONGE LAP 4X18 RFD (DISPOSABLE) ×2 IMPLANT
STRIP CLOSURE SKIN 1/2X4 (GAUZE/BANDAGES/DRESSINGS) ×2 IMPLANT
SUT MNCRL AB 4-0 PS2 18 (SUTURE) IMPLANT
SUT MON AB 5-0 PS2 18 (SUTURE) ×1 IMPLANT
SUT VIC AB 2-0 SH 27 (SUTURE) ×2
SUT VIC AB 2-0 SH 27XBRD (SUTURE) ×1 IMPLANT
SUT VIC AB 3-0 SH 27 (SUTURE) ×2
SUT VIC AB 3-0 SH 27X BRD (SUTURE) ×1 IMPLANT
SYR CONTROL 10ML LL (SYRINGE) ×2 IMPLANT
TOWEL GREEN STERILE FF (TOWEL DISPOSABLE) ×2 IMPLANT
TRAY FAXITRON CT DISP (TRAY / TRAY PROCEDURE) ×2 IMPLANT

## 2019-06-23 NOTE — Interval H&P Note (Signed)
History and Physical Interval Note:  06/23/2019 9:52 AM  Katie Ho  has presented today for surgery, with the diagnosis of LEFT BREAST MASS.  The various methods of treatment have been discussed with the patient and family. After consideration of risks, benefits and other options for treatment, the patient has consented to  Procedure(s): LEFT BREAST RADIOACTIVE SEED GUIDED EXCISIONAL BREAST BIOPSY (Left) as a surgical intervention.  The patient's history has been reviewed, patient examined, no change in status, stable for surgery.  I have reviewed the patient's chart and labs.  Questions were answered to the patient's satisfaction.     Emelia Loron

## 2019-06-23 NOTE — Discharge Instructions (Signed)
No Tylenol until 2:45pm if needed. No Ibuprofen until 4:45pm if needed.   [facility] Office Phone Number [phone]  BREAST BIOPSY/ PARTIAL MASTECTOMY: POST OP INSTRUCTIONS Take 400 mg of ibuprofen every 8 hours or 650 mg tylenol every 6 hours for next 72 hours then as needed. Use ice several times daily also. Always review your discharge instruction sheet given to you by the facility where your surgery was performed.  IF YOU HAVE DISABILITY OR FAMILY LEAVE FORMS, YOU MUST BRING THEM TO THE OFFICE FOR PROCESSING.  DO NOT GIVE THEM TO YOUR DOCTOR.  1. A prescription for pain medication may be given to you upon discharge.  Take your pain medication as prescribed, if needed.  If narcotic pain medicine is not needed, then you may take acetaminophen (Tylenol), naprosyn (Alleve) or ibuprofen (Advil) as needed. 2. Take your usually prescribed medications unless otherwise directed 3. If you need a refill on your pain medication, please contact your pharmacy.  They will contact our office to request authorization.  Prescriptions will not be filled after 5pm or on week-ends. 4. You should eat very light the first 24 hours after surgery, such as soup, crackers, pudding, etc.  Resume your normal diet the day after surgery. 5. Most patients will experience some swelling and bruising in the breast.  Ice packs and a good support bra will help.  Wear the breast binder provided or a sports bra for 72 hours day and night.  After that wear a sports bra during the day until you return to the office. Swelling and bruising can take several days to resolve.  6. It is common to experience some constipation if taking pain medication after surgery.  Increasing fluid intake and taking a stool softener will usually help or prevent this problem from occurring.  A mild laxative (Milk of Magnesia or Miralax) should be taken according to package directions if there are no bowel movements after 48  hours. 7. Unless discharge instructions indicate otherwise, you may remove your bandages 48 hours after surgery and you may shower at that time.  You may have steri-strips (small skin tapes) in place directly over the incision.  These strips should be left on the skin for 7-10 days and will come off on their own.  If your surgeon used skin glue on the incision, you may shower in 24 hours.  The glue will flake off over the next 2-3 weeks.  Any sutures or staples will be removed at the office during your follow-up visit. 8. ACTIVITIES:  You may resume regular daily activities (gradually increasing) beginning the next day.  Wearing a good support bra or sports bra minimizes pain and swelling.  You may have sexual intercourse when it is comfortable. a. You may drive when you no longer are taking prescription pain medication, you can comfortably wear a seatbelt, and you can safely maneuver your car and apply brakes. b. RETURN TO WORK:  ______________________________________________________________________________________ 9. You should see your doctor in the office for a follow-up appointment approximately two weeks after your surgery.  Your doctor's nurse will typically make your follow-up appointment when she calls you with your pathology report.  Expect your pathology report 3-4 business days after your surgery.  You may call to check if you do not hear from [place] after three days. 10. OTHER INSTRUCTIONS: _______________________________________________________________________________________________ _____________________________________________________________________________________________________________________________________ _____________________________________________________________________________________________________________________________________ _____________________________________________________________________________________________________________________________________  WHEN TO CALL DR  WAKEFIELD: 1. Fever over 101.0 2. Nausea and/or vomiting. 3. Extreme swelling or bruising. 4. Continued bleeding  from incision. 5. Increased pain, redness, or drainage from the incision.  The clinic staff is available to answer your questions during regular business hours.  Please don't hesitate to call and ask to speak to one of the nurses for clinical concerns.  If you have a medical emergency, go to the nearest emergency room or call 911.  A surgeon from [facility] is always on call at the hospital.  For further questions, please visit [email or web address]   Post Anesthesia Home Care Instructions  Activity: Get plenty of rest for the remainder of the day. A responsible individual must stay with you for 24 hours following the procedure.  For the next 24 hours, DO NOT: -Drive a car -Paediatric nurse -Drink alcoholic beverages -Take any medication unless instructed by your physician -Make any legal decisions or sign important papers.  Meals: Start with liquid foods such as gelatin or soup. Progress to regular foods as tolerated. Avoid greasy, spicy, heavy foods. If nausea and/or vomiting occur, drink only clear liquids until the nausea and/or vomiting subsides. Call your physician if vomiting continues.  Special Instructions/Symptoms: Your throat may feel dry or sore from the anesthesia or the breathing tube placed in your throat during surgery. If this causes discomfort, gargle with warm salt water. The discomfort should disappear within 24 hours.  If you had a scopolamine patch placed behind your ear for the management of post- operative nausea and/or vomiting:  1. The medication in the patch is effective for 72 hours, after which it should be removed.  Wrap patch in a tissue and discard in the trash. Wash hands thoroughly with soap and water. 2. You may remove the patch earlier than 72 hours if you experience unpleasant side effects which may include dry  mouth, dizziness or visual disturbances. 3. Avoid touching the patch. Wash your hands with soap and water after contact with the patch.

## 2019-06-23 NOTE — H&P (Signed)
Katie Ho is an 59 y.o. female.   Chief Complaint: abnl mm HPI: 13 yof referred by Dr Osborne Casco for left breast lesion several months ago. she has fh in pgm age 52 as well as a maternal aunt at age 53 of bca. 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 she has no prior breast history. she did not have a mass or dc. she had a screening mm that shows c density breasts. there were left breast calcs on screening. on diagnostic views there are a 5 mm group of calcs in the posterior breast centrally. stereo biopsy was performed. clip was placed. pathology is benign breast tissue with a small complex sclerosing lesion noted. there are a few residual microcalcs. when we discussed before she had alot of home issues (husband who had fractured his ankle) and was not really in place to undergo surgery. since then she has had no changes. repeat left sided mm shows a few punctate residual calcs 2.1 cm ant and inferior to biopsy clip. she returns today to discuss options  Past Medical History:  Diagnosis Date  . Hyperlipidemia   . Hyperlipidemia   . PONV (postoperative nausea and vomiting)     Past Surgical History:  Procedure Laterality Date  . ABDOMINAL HYSTERECTOMY    . BREAST EXCISIONAL BIOPSY Left 2020  . CESAREAN SECTION      Family History  Problem Relation Age of Onset  . Heart disease Father   . Cancer Maternal Aunt 40       breast  . Mental illness Maternal Grandmother   . Cancer Maternal Grandfather        lung  . Cancer Paternal Grandmother 46       breast   Social History:  reports that she has never smoked. She has never used smokeless tobacco. She reports that she does not drink alcohol or use drugs.  Allergies: No Known Allergies  Medications Prior to Admission  Medication Sig Dispense Refill  . Cholecalciferol (VITAMIN D3) 125 MCG (5000 UT) CAPS Take 2 capsules by mouth daily.    . fenofibrate 160 MG tablet TAKE 1 TABLET BY MOUTH ONCE DAILY 30 tablet 3     No results found for this or any previous visit (from the past 48 hour(s)). MM LT RADIOACTIVE SEED LOC MAMMO GUIDE  Result Date: 06/22/2019 CLINICAL DATA:  59 year old patient had a stereotactic biopsy recommended for calcifications in the far posterior central left breast in September 2020. Biopsy was performed via a medial approach showing benign breast parenchyma with fibrocystic change, and a small complex sclerosing lesion and calcifications of the left breast with excision recommended. All of the patient's recent imaging since August 2020, as well as earlier priors for comparison were performed in detail today. Please note that there was no architectural distortion as a sign of complex sclerosing lesion, but that it was identified incidentally at the time of biopsy. The biopsy clip was thought to be satisfactorily position on the post biopsy clip images and the clip is far posteriorly positioned in the central left breast. Recent diagnostic mammogram performed May 11, 2019 was performed, demonstrating a few punctate calcifications anterior and inferior to the biopsy clip. It is noted that the patient has diffusely scattered punctate calcifications in the left breast, with no discrete suspicious cluster to target. The case was reviewed with Dr. Donne Hazel by telephone. It is decided to localize the biopsy clip in the posterior central left breast. EXAM: MAMMOGRAPHIC GUIDED RADIOACTIVE SEED  LOCALIZATION OF THE LEFT BREAST COMPARISON:  Previous exam(s). FINDINGS: Patient presents for radioactive seed localization prior to excision. I met with the patient and we discussed the procedure of seed localization including benefits and alternatives. We discussed the high likelihood of a successful procedure. We discussed the risks of the procedure including infection, bleeding, tissue injury and further surgery. We discussed the low dose of radioactivity involved in the procedure. Informed, written consent was  given. The usual time-out protocol was performed immediately prior to the procedure. Using mammographic guidance, sterile technique, 1% lidocaine and an I-125 radioactive seed, the coil shaped biopsy clip in the far posterior central left breast was localized using a lateral approach. The follow-up mammogram images confirm the seed in the expected location and were marked for Dr. Follow-up survey of the patient confirms presence of the radioactive seed. Order number of I-125 seed:  [id number]. Total activity: 0.251 mCi reference Date: 14 April 2019 The patient tolerated the procedure well and was released from the [place]. She was given instructions regarding seed removal. IMPRESSION: Radioactive seed localization left breast. No apparent complications. Electronically Signed   By: Curlene Dolphin M.D.   On: 06/22/2019 15:16    Review of Systems  All other systems reviewed and are negative.   Blood pressure 137/72, pulse 85, temperature (!) 97.5 F (36.4 C), temperature source Oral, resp. rate 16, height 5' 2"  (1.575 m), weight 64.1 kg, SpO2 100 %. Physical Exam  General Mental Status-Alert. Orientation-Oriented X3. Breast Nipples-No Discharge. Breast Lump-No Palpable Breast Mass. Lymphatic Head & Neck General Head & Neck Lymphatics: Bilateral - Description - Normal. Axillary General Axillary Region: Bilateral - Description - Normal. Note: no [place] adenopathy cv rrr  Lungs clear   Assessment/Plan RADIAL SCAR OF LEFT BREAST (N64.89) Story: Left breast seed guided excision discussed options again. no real changes since last mm but hard to follow. she is in place now where she would like to just have this excised with small risk of upgrade. we discussed seed guided excisional biopsy, risks and recovery. will schedule   Rolm Bookbinder, MD 06/23/2019, 9:51 AM

## 2019-06-23 NOTE — Anesthesia Preprocedure Evaluation (Signed)
Anesthesia Evaluation  Patient identified by MRN, date of birth, ID band Patient awake    Reviewed: Allergy & Precautions, NPO status , Patient's Chart, lab work & pertinent test results  History of Anesthesia Complications (+) PONV  Airway Mallampati: II  TM Distance: >3 FB Neck ROM: Full    Dental no notable dental hx.    Pulmonary neg pulmonary ROS,    Pulmonary exam normal breath sounds clear to auscultation       Cardiovascular negative cardio ROS Normal cardiovascular exam Rhythm:Regular Rate:Normal     Neuro/Psych negative neurological ROS  negative psych ROS   GI/Hepatic negative GI ROS, Neg liver ROS,   Endo/Other  negative endocrine ROS  Renal/GU negative Renal ROS  negative genitourinary   Musculoskeletal negative musculoskeletal ROS (+)   Abdominal   Peds negative pediatric ROS (+)  Hematology negative hematology ROS (+)   Anesthesia Other Findings   Reproductive/Obstetrics negative OB ROS                             Anesthesia Physical Anesthesia Plan  ASA: II  Anesthesia Plan: General   Post-op Pain Management:    Induction: Intravenous  PONV Risk Score and Plan: 4 or greater and Ondansetron, Dexamethasone, Midazolam, Treatment may vary due to age or medical condition and Droperidol  Airway Management Planned: LMA  Additional Equipment:   Intra-op Plan:   Post-operative Plan: Extubation in OR  Informed Consent: I have reviewed the patients History and Physical, chart, labs and discussed the procedure including the risks, benefits and alternatives for the proposed anesthesia with the patient or authorized representative who has indicated his/her understanding and acceptance.     Dental advisory given  Plan Discussed with: CRNA and Surgeon  Anesthesia Plan Comments:         Anesthesia Quick Evaluation

## 2019-06-23 NOTE — Op Note (Signed)
Preoperative diagnosis:Left breast mammographic distortion and calcs Postoperative diagnosis: Same as above Procedure: Left breast seed guided excisional biopsy Surgeon: Dr Harden Mo Anesthesia: General estimated blood loss:minimal Specimens:leftbreast tissue marked with paint containing seed and clip Drains none Complications none Sponge needle count was correct at completion Disposition to recovery in stable condition  Indications: 51 yof referred by Dr Wylene Simmer for left breast lesion several months ago. she has fh in pgm age 33 as well as a maternal aunt at age 55 of bca. 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 she has no prior breast history. she did not have a mass or dc. she had a screening mm that shows c density breasts. there were left breast calcs on screening. on diagnostic views there are a 5 mm group of calcs in the posterior breast centrally. stereo biopsy was performed. clip was placed. pathology is benign breast tissue with a small complex sclerosing lesion noted. there are a few residual microcalcs. when we discussed before she had alot of home issues (husband who had fractured his ankle) and was not really in place to undergo surgery. since then she has had no changes. repeat left sided mm shows a few punctate residual calcs 2.1 cm ant and inferior to biopsy clip. After discussion we elected to proceed with seed guided excision. I discussed seed and excision with Dr Mayford Knife prior to beginning as well.   Procedure: After informed consent was obtained the patient was first given antibiotics. SCDs were placed. She was placed under general anesthesia without complication. She was prepped and draped in the standard sterile surgical fashion. Surgical timeout was then performed.  I had the mammograms available for my review in the operating room. I confirmed the seed was present prior to beginning. Imade a periareolar incision to hide the scar later. I  infiltrated this with Marcaine. I then made an incision. I then removedthe seed and some of the surrounding tissue. I then marked this with paint. I passed this off the table and mammogram confirmed removal of the seed and the clip. I then obtained hemostasis. I closed the breast tissue with 2-0 Vicryl suture. I then closed the skin with 3-0 Vicryl and5-0 Monocryl. Glue and Steri-Strips were applied. She tolerated this well was extubated and transferred to recovery stable.

## 2019-06-23 NOTE — Anesthesia Procedure Notes (Signed)
Procedure Name: LMA Insertion Date/Time: 06/23/2019 10:06 AM Performed by: [place] Desanctis, CRNA Pre-anesthesia Checklist: Patient identified, Emergency Drugs available, Suction available, Patient being monitored and Timeout performed Patient Re-evaluated:Patient Re-evaluated prior to induction Oxygen Delivery Method: Circle system utilized Preoxygenation: Pre-oxygenation with 100% oxygen Induction Type: IV induction Ventilation: Mask ventilation without difficulty LMA: LMA inserted LMA Size: 4.0 Number of attempts: 1 Airway Equipment and Method: Bite block Placement Confirmation: positive ETCO2 Tube secured with: Tape Dental Injury: Teeth and Oropharynx as per pre-operative assessment

## 2019-06-23 NOTE — Transfer of Care (Signed)
Immediate Anesthesia Transfer of Care Note  Patient: Katie Ho  Procedure(s) Performed: LEFT BREAST RADIOACTIVE SEED GUIDED EXCISIONAL BREAST BIOPSY (Left Breast)  Patient Location: PACU  Anesthesia Type:General  Level of Consciousness: awake, alert , oriented and patient cooperative  Airway & Oxygen Therapy: Patient Spontanous Breathing and Patient connected to face mask oxygen  Post-op Assessment: Report given to RN and Post -op Vital signs reviewed and stable  Post vital signs: Reviewed and stable  Last Vitals:  Vitals Value Taken Time  BP    Temp    Pulse 114 06/23/19 1050  Resp    SpO2 100 % 06/23/19 1050  Vitals shown include unvalidated device data.  Last Pain:  Vitals:   06/23/19 0838  TempSrc: Oral  PainSc: 0-No pain      Patients Stated Pain Goal: 3 (06/23/19 7371)  Complications: No apparent anesthesia complications

## 2019-06-23 NOTE — Anesthesia Postprocedure Evaluation (Signed)
Anesthesia Post Note  Patient: Yaminah Clayborn  Procedure(s) Performed: LEFT BREAST RADIOACTIVE SEED GUIDED EXCISIONAL BREAST BIOPSY (Left Breast)     Patient location during evaluation: PACU Anesthesia Type: General Level of consciousness: awake and alert Pain management: pain level controlled Vital Signs Assessment: post-procedure vital signs reviewed and stable Respiratory status: spontaneous breathing, nonlabored ventilation, respiratory function stable and patient connected to nasal cannula oxygen Cardiovascular status: blood pressure returned to baseline and stable Postop Assessment: no apparent nausea or vomiting Anesthetic complications: no    Last Vitals:  Vitals:   06/23/19 1050 06/23/19 1100  BP:  117/69  Pulse: (!) 114 (!) 106  Resp:  17  Temp:    SpO2: 100% 98%    Last Pain:  Vitals:   06/23/19 1049  TempSrc:   PainSc: 0-No pain                 Ahriyah Vannest S

## 2019-06-24 ENCOUNTER — Encounter: Payer: Self-pay | Admitting: *Deleted

## 2019-06-27 LAB — SURGICAL PATHOLOGY

## 2019-08-15 MED FILL — FENOFIBRATE 160 MG TABLET: 160 | 90 days supply | Qty: 90 | Fill #1

## 2019-09-23 DIAGNOSIS — Z23 Encounter for immunization: Secondary | ICD-10-CM | POA: Diagnosis not present

## 2019-09-28 DIAGNOSIS — R82998 Other abnormal findings in urine: Secondary | ICD-10-CM | POA: Diagnosis not present

## 2019-09-28 DIAGNOSIS — E781 Pure hyperglyceridemia: Secondary | ICD-10-CM | POA: Diagnosis not present

## 2019-09-28 DIAGNOSIS — M859 Disorder of bone density and structure, unspecified: Secondary | ICD-10-CM | POA: Diagnosis not present

## 2019-09-28 DIAGNOSIS — Z0000 Encounter for general adult medical examination without abnormal findings: Secondary | ICD-10-CM | POA: Diagnosis not present

## 2019-10-05 DIAGNOSIS — Z0000 Encounter for general adult medical examination without abnormal findings: Secondary | ICD-10-CM | POA: Diagnosis not present

## 2019-10-05 DIAGNOSIS — Z79890 Hormone replacement therapy (postmenopausal): Secondary | ICD-10-CM | POA: Diagnosis not present

## 2019-10-05 DIAGNOSIS — E559 Vitamin D deficiency, unspecified: Secondary | ICD-10-CM | POA: Diagnosis not present

## 2019-10-05 DIAGNOSIS — Z8249 Family history of ischemic heart disease and other diseases of the circulatory system: Secondary | ICD-10-CM | POA: Diagnosis not present

## 2019-10-05 DIAGNOSIS — L719 Rosacea, unspecified: Secondary | ICD-10-CM | POA: Diagnosis not present

## 2019-10-05 DIAGNOSIS — E781 Pure hyperglyceridemia: Secondary | ICD-10-CM | POA: Diagnosis not present

## 2019-10-05 DIAGNOSIS — K589 Irritable bowel syndrome without diarrhea: Secondary | ICD-10-CM | POA: Diagnosis not present

## 2019-10-05 DIAGNOSIS — M8580 Other specified disorders of bone density and structure, unspecified site: Secondary | ICD-10-CM | POA: Diagnosis not present

## 2019-10-05 MED FILL — metroNIDAZOLE 0.75 % CREA: 0.75 | 90 days supply | Qty: 90 | Fill #0

## 2019-10-06 DIAGNOSIS — Z1212 Encounter for screening for malignant neoplasm of rectum: Secondary | ICD-10-CM | POA: Diagnosis not present

## 2020-04-09 ENCOUNTER — Other Ambulatory Visit (HOSPITAL_BASED_OUTPATIENT_CLINIC_OR_DEPARTMENT_OTHER): Payer: Self-pay | Admitting: Internal Medicine

## 2020-04-09 MED FILL — FENOFIBRATE 160 MG TABLET: 160 | 90 days supply | Qty: 90 | Fill #0

## 2020-08-19 IMAGING — MG MM PLC BREAST LOC DEV 1ST LESION INC MAMMO GUIDE*L*
8 of 16 series · 8 of 28 positions shown · non-contrast
Comparison: Previous exam(s).

CLINICAL DATA: 58-year-old patient had a stereotactic biopsy
recommended for calcifications in the far posterior central left
breast in November 2018. Biopsy was performed via a medial approach
showing benign breast parenchyma with fibrocystic change, and a
small complex sclerosing lesion and calcifications of the left
breast with excision recommended. All of the patient's recent
imaging since October 2018, as well as earlier priors for comparison
were performed in detail today. Please note that there was no
architectural distortion as a sign of complex sclerosing lesion, but
that it was identified incidentally at the time of biopsy. The
biopsy clip was thought to be satisfactorily position on the post
biopsy clip images and the clip is far posteriorly positioned in the
central left breast. Recent diagnostic mammogram performed May 11, 2019 was performed, demonstrating a few punctate calcifications
anterior and inferior to the biopsy clip. It is noted that the
patient has diffusely scattered punctate calcifications in the left
breast, with no discrete suspicious cluster to target.

The case was reviewed with Dr. Paulao by telephone. It is decided
to localize the biopsy clip in the posterior central left breast.
EXAM:
MAMMOGRAPHIC GUIDED RADIOACTIVE SEED LOCALIZATION OF THE LEFT BREAST

[L LM (1 of 3)]
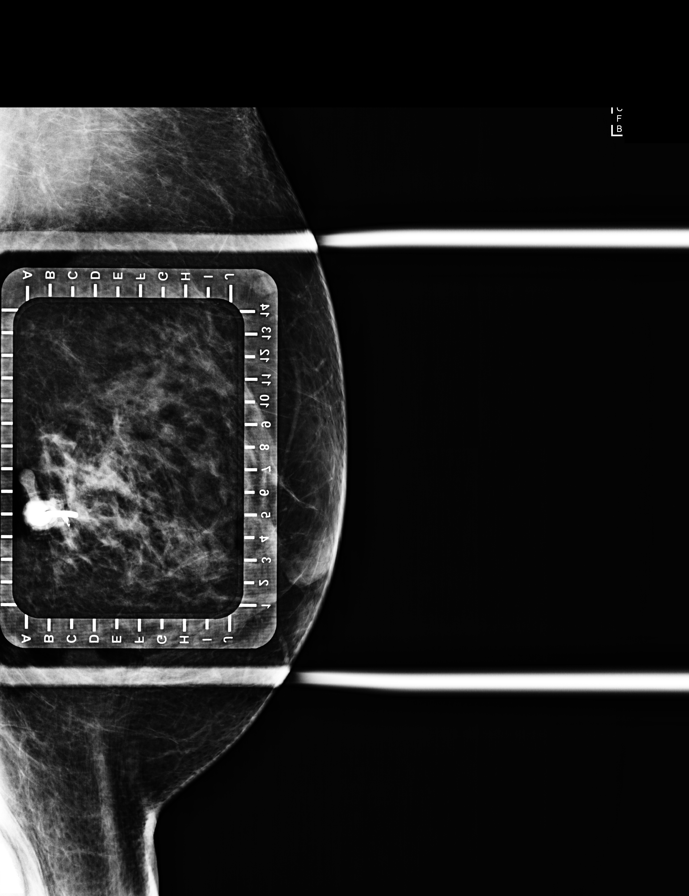

[L LM (2 of 3)]
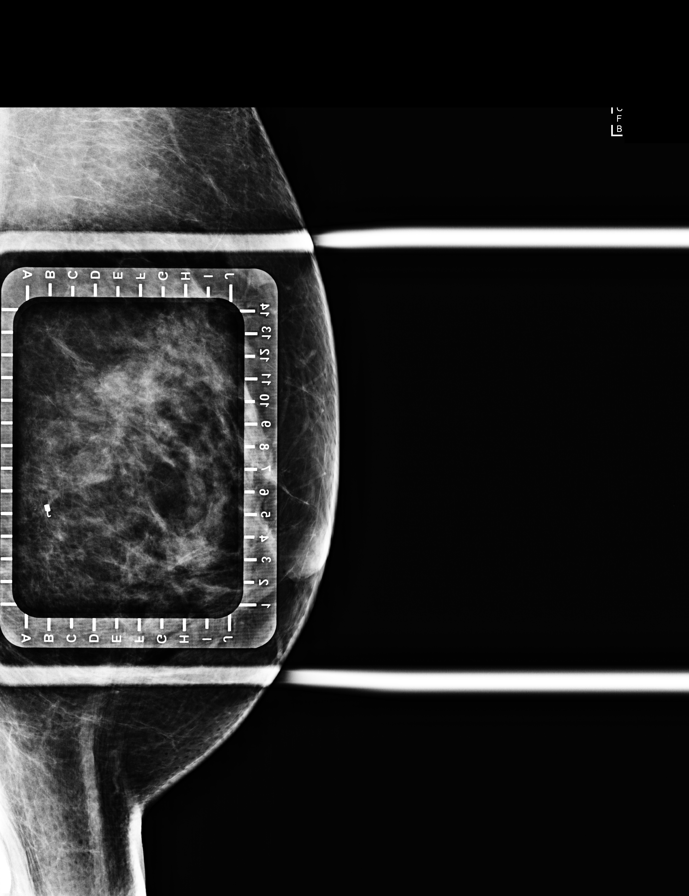

[L CC (1 of 5)]
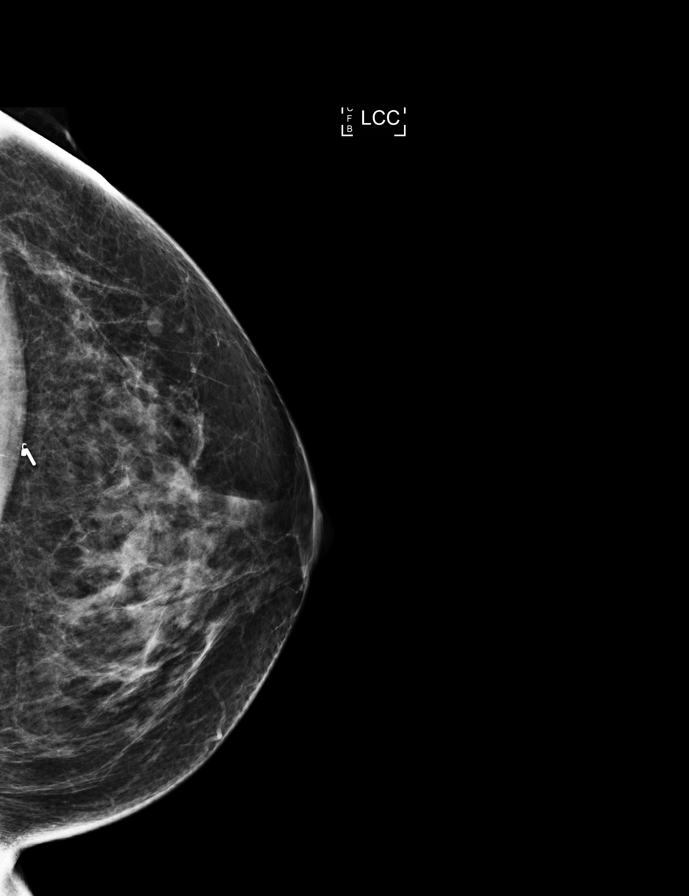

[L CC (2 of 5)]
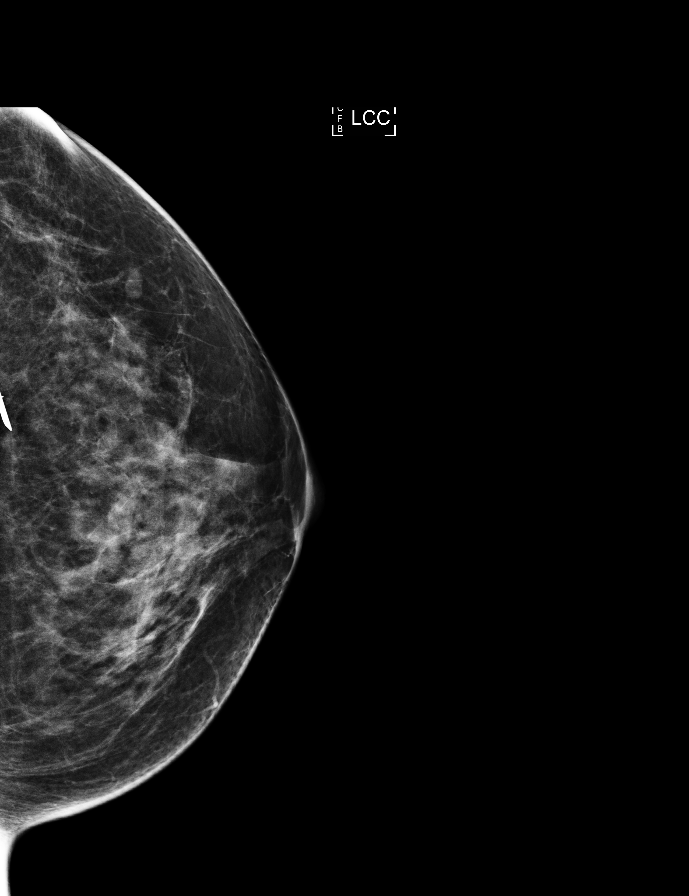

[L CC (3 of 5)]
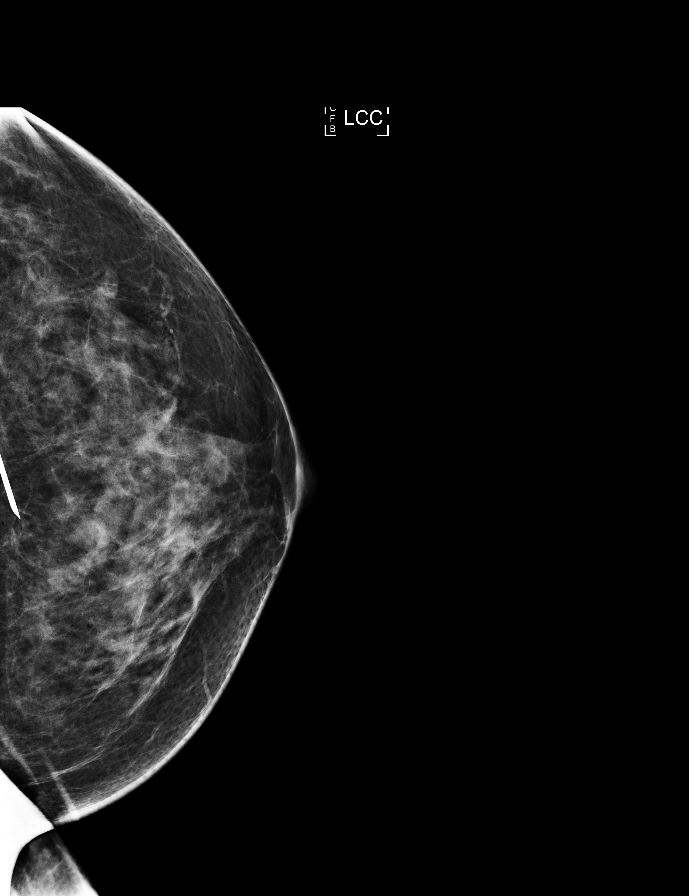

[L LM (3 of 3)]
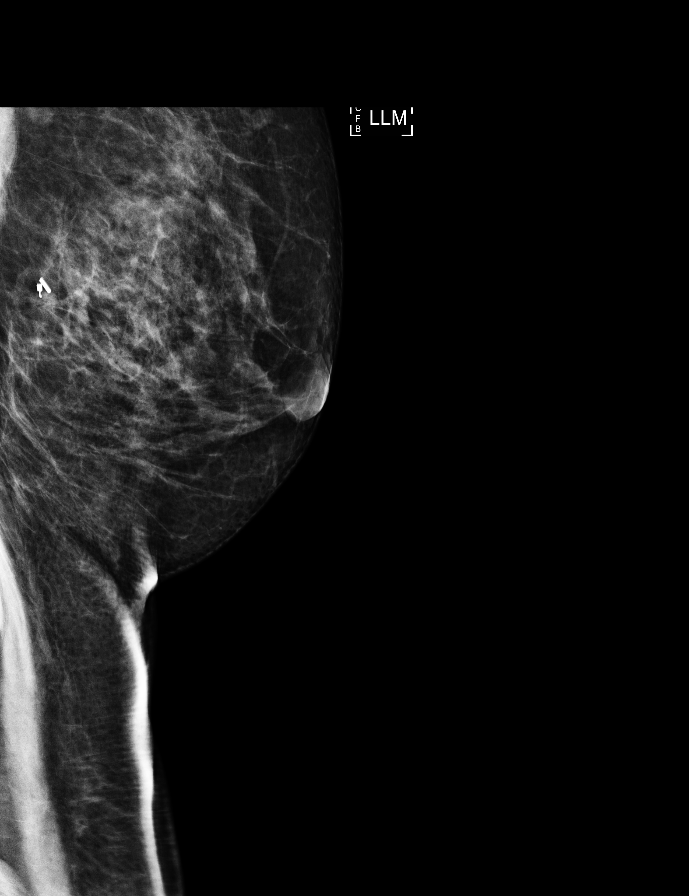

[L CC (4 of 5)]
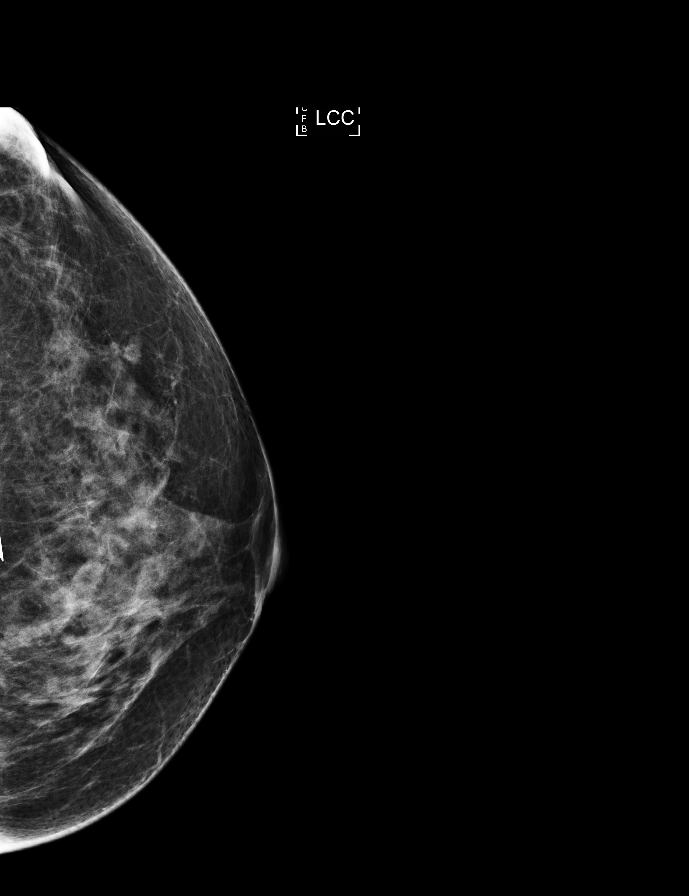

[L CC (5 of 5)]
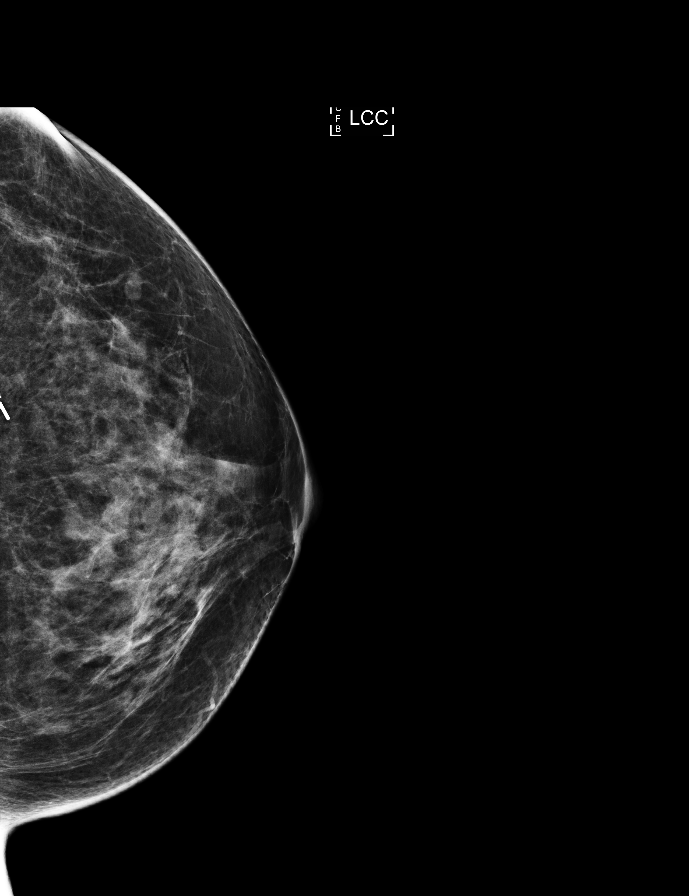

[8 of 28 positions shown; findings below may reference images not displayed]

FINDINGS: Patient presents for radioactive seed localization prior to
excision. I met with the patient and we discussed the procedure of
seed localization including benefits and alternatives. We discussed
the high likelihood of a successful procedure. We discussed the
risks of the procedure including infection, bleeding, tissue injury
and further surgery. We discussed the low dose of radioactivity
involved in the procedure. Informed, written consent was given.

The usual time-out protocol was performed immediately prior to the
procedure.

Using mammographic guidance, sterile technique, 1% lidocaine and an
8-XP0 radioactive seed, the coil shaped biopsy clip in the far
posterior central left breast was localized using a lateral
approach. The follow-up mammogram images confirm the seed in the
expected location and were marked for Dr.

Follow-up survey of the patient confirms presence of the radioactive
seed.

Order number of 8-XP0 seed:  [id number].

Total activity: 0.251 mCi reference Date: 14 April, 2019

The patient tolerated the procedure well and was released from the
[REDACTED]. She was given instructions regarding seed removal.
IMPRESSION: Radioactive seed localization left breast. No apparent
complications.

## 2020-08-20 IMAGING — DX MM BREAST SURGICAL SPECIMEN
1 series · 2 of 2 positions shown · non-contrast
Comparison: Previous exam(s).

CLINICAL DATA: Post left breast excision.

EXAM:
SPECIMEN RADIOGRAPH OF THE LEFT BREAST

[Series 1: specimen digital x-ray · left · 0.10mm/px · 2 of 2 slices shown]
[im 1/2]
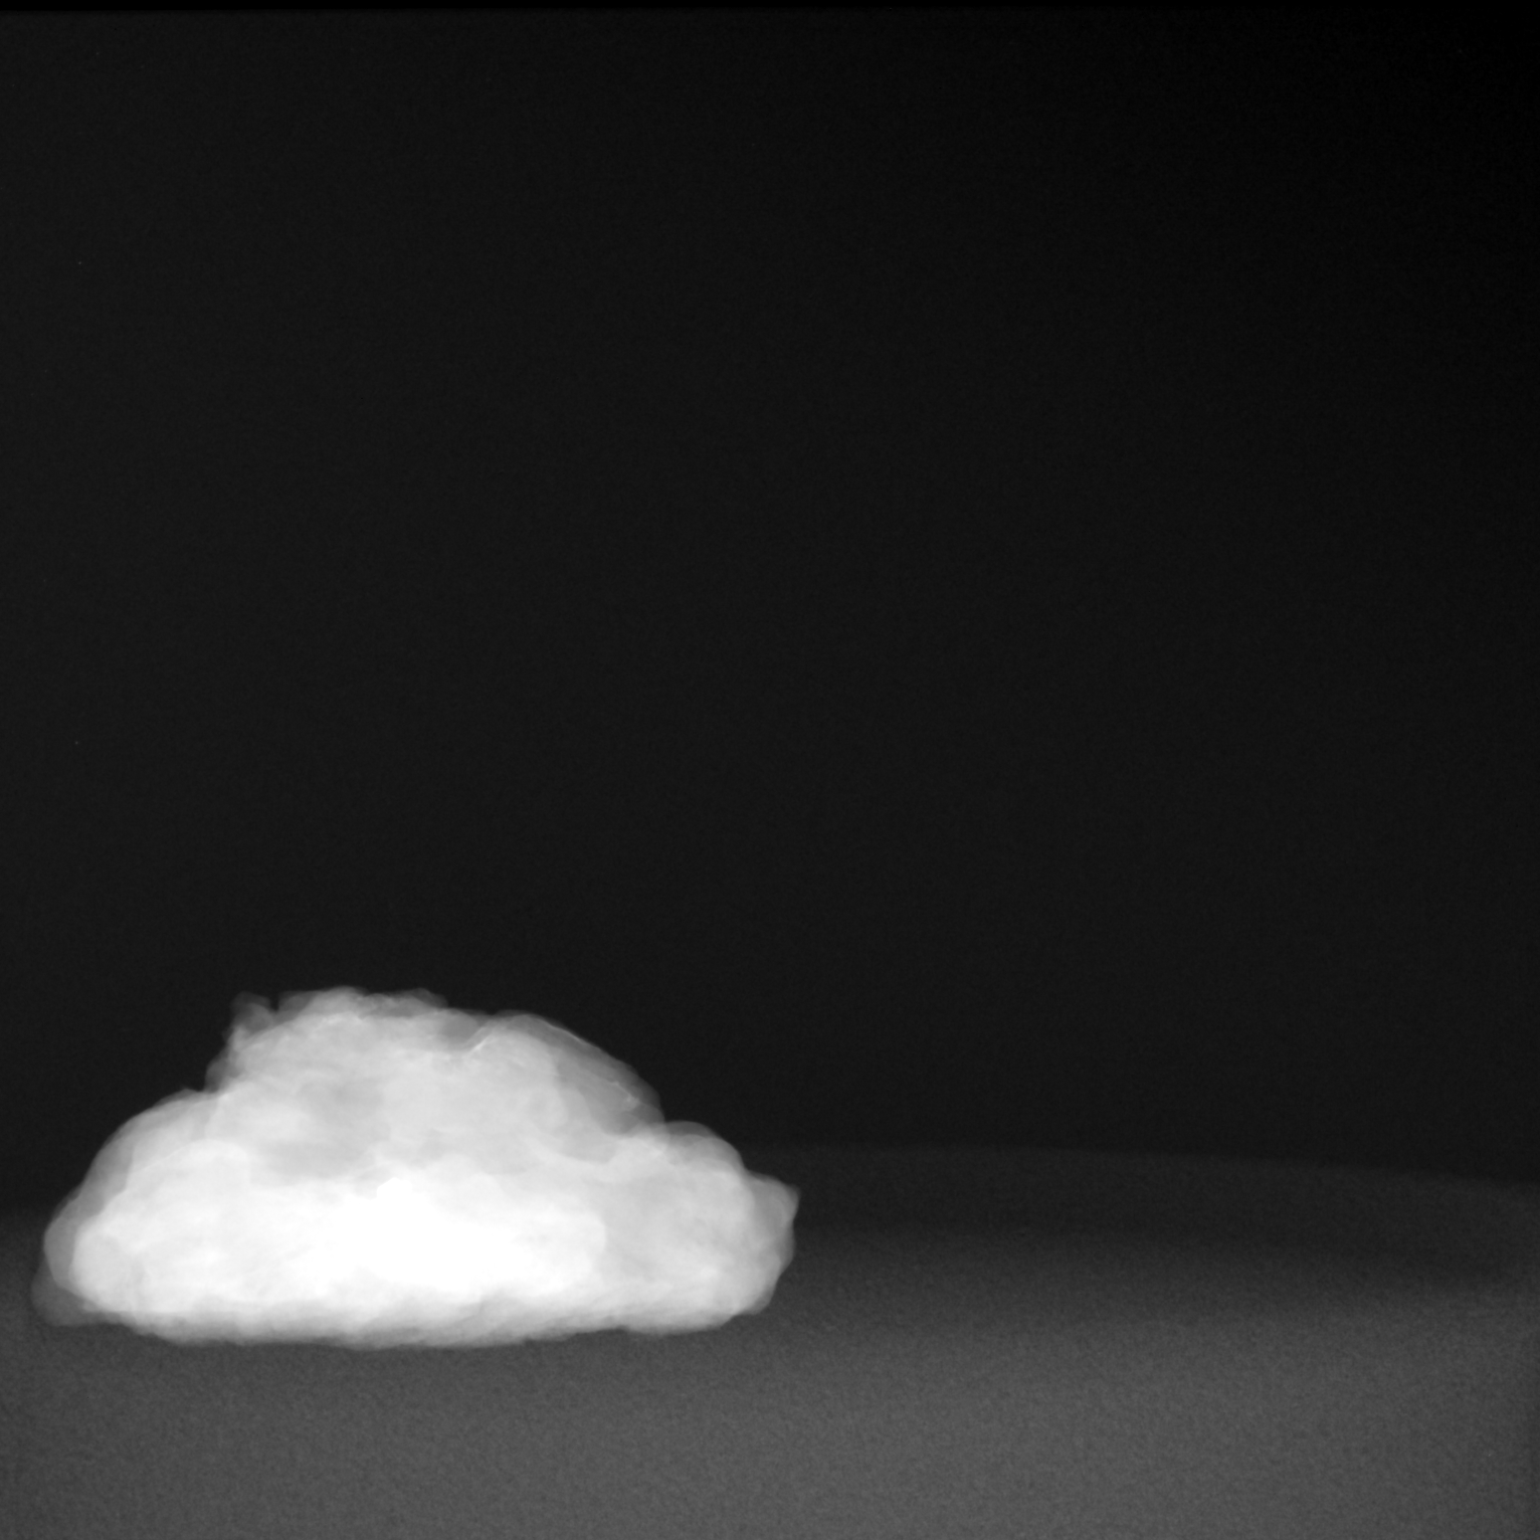
[im 2/2]
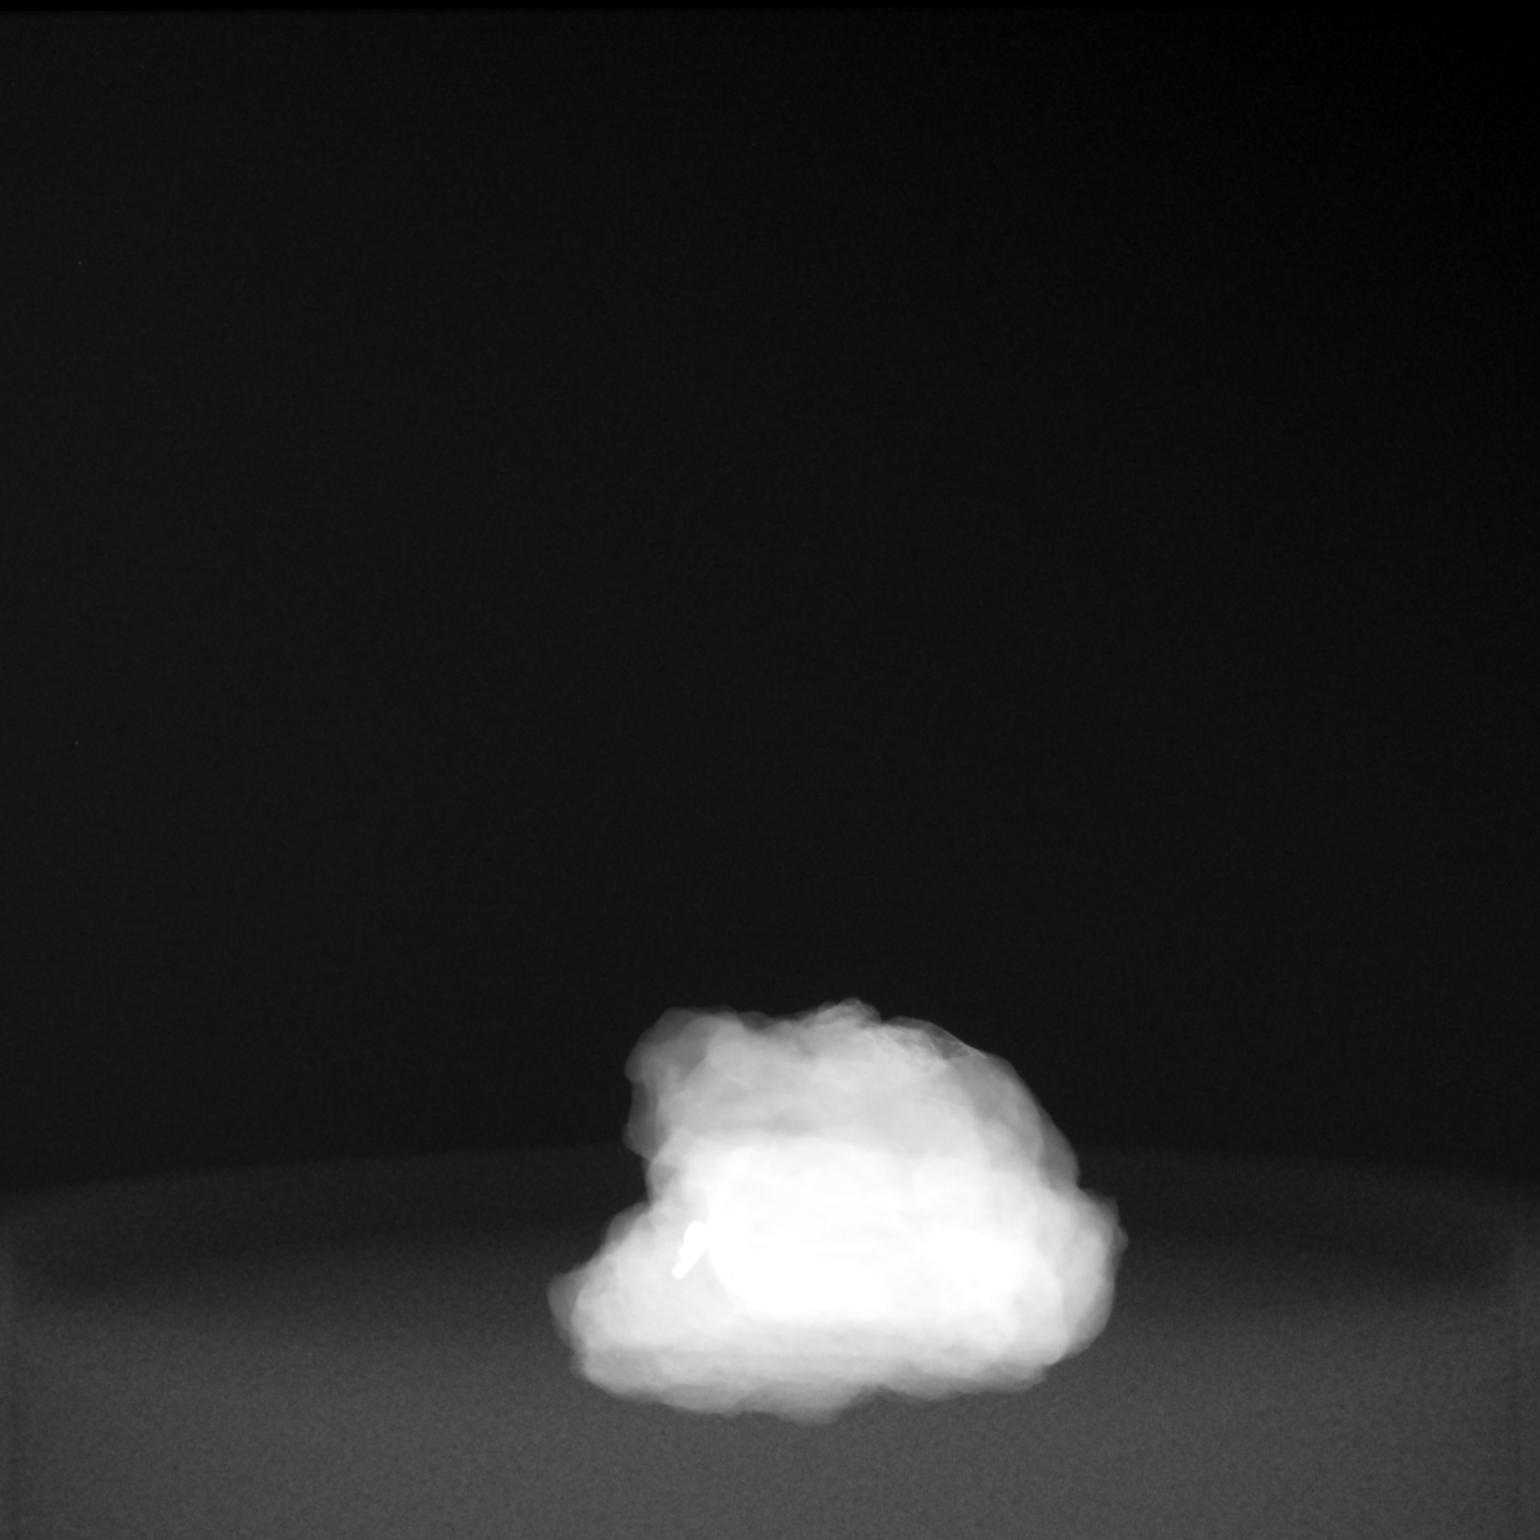

[2 of 2 positions shown; findings below may reference images not displayed]

FINDINGS: Status post excision of the left breast. The radioactive seed and
coil shaped biopsy marker clip are present, completely intact, and
were marked for pathology.
IMPRESSION: Specimen radiograph of the left breast.

## 2020-08-28 ENCOUNTER — Encounter (HOSPITAL_COMMUNITY): Payer: Self-pay

## 2020-09-20 ENCOUNTER — Other Ambulatory Visit (HOSPITAL_BASED_OUTPATIENT_CLINIC_OR_DEPARTMENT_OTHER): Payer: Self-pay

## 2020-09-20 MED FILL — Fenofibrate Tab 160 MG: ORAL | 90 days supply | Qty: 90 | Fill #0 | Status: AC

## 2020-10-03 DIAGNOSIS — E781 Pure hyperglyceridemia: Secondary | ICD-10-CM | POA: Diagnosis not present

## 2020-10-03 DIAGNOSIS — E559 Vitamin D deficiency, unspecified: Secondary | ICD-10-CM | POA: Diagnosis not present

## 2020-10-22 ENCOUNTER — Other Ambulatory Visit (HOSPITAL_BASED_OUTPATIENT_CLINIC_OR_DEPARTMENT_OTHER): Payer: Self-pay

## 2020-10-22 DIAGNOSIS — E781 Pure hyperglyceridemia: Secondary | ICD-10-CM | POA: Diagnosis not present

## 2020-10-22 DIAGNOSIS — F411 Generalized anxiety disorder: Secondary | ICD-10-CM | POA: Diagnosis not present

## 2020-10-22 DIAGNOSIS — E559 Vitamin D deficiency, unspecified: Secondary | ICD-10-CM | POA: Diagnosis not present

## 2020-10-22 DIAGNOSIS — Z0000 Encounter for general adult medical examination without abnormal findings: Secondary | ICD-10-CM | POA: Diagnosis not present

## 2020-10-22 DIAGNOSIS — Z79890 Hormone replacement therapy (postmenopausal): Secondary | ICD-10-CM | POA: Diagnosis not present

## 2020-10-22 DIAGNOSIS — Z8249 Family history of ischemic heart disease and other diseases of the circulatory system: Secondary | ICD-10-CM | POA: Diagnosis not present

## 2020-10-22 DIAGNOSIS — L719 Rosacea, unspecified: Secondary | ICD-10-CM | POA: Diagnosis not present

## 2020-10-22 DIAGNOSIS — K589 Irritable bowel syndrome without diarrhea: Secondary | ICD-10-CM | POA: Diagnosis not present

## 2020-10-22 DIAGNOSIS — M8580 Other specified disorders of bone density and structure, unspecified site: Secondary | ICD-10-CM | POA: Diagnosis not present

## 2020-10-22 MED ORDER — SERTRALINE HCL 50 MG PO TABS
ORAL_TABLET | ORAL | 3 refills | Status: AC
  Filled 2020-10-22: qty 90, 90d supply, fill #0
  Filled 2021-01-13: qty 90, 90d supply, fill #1

## 2020-10-23 DIAGNOSIS — R82998 Other abnormal findings in urine: Secondary | ICD-10-CM | POA: Diagnosis not present

## 2020-12-04 ENCOUNTER — Other Ambulatory Visit (HOSPITAL_BASED_OUTPATIENT_CLINIC_OR_DEPARTMENT_OTHER): Payer: Self-pay

## 2020-12-04 MED ORDER — CARESTART COVID-19 HOME TEST VI KIT
PACK | 0 refills | Status: AC
  Filled 2020-12-04: qty 2, 4d supply, fill #0

## 2021-01-14 ENCOUNTER — Other Ambulatory Visit (HOSPITAL_BASED_OUTPATIENT_CLINIC_OR_DEPARTMENT_OTHER): Payer: Self-pay

## 2021-01-15 ENCOUNTER — Other Ambulatory Visit (HOSPITAL_BASED_OUTPATIENT_CLINIC_OR_DEPARTMENT_OTHER): Payer: Self-pay

## 2021-01-15 MED FILL — Fenofibrate Tab 160 MG: ORAL | 90 days supply | Qty: 90 | Fill #1 | Status: AC

## 2021-02-05 DIAGNOSIS — B350 Tinea barbae and tinea capitis: Secondary | ICD-10-CM | POA: Diagnosis not present

## 2021-04-08 DIAGNOSIS — Z1212 Encounter for screening for malignant neoplasm of rectum: Secondary | ICD-10-CM | POA: Diagnosis not present

## 2021-04-22 ENCOUNTER — Other Ambulatory Visit (HOSPITAL_BASED_OUTPATIENT_CLINIC_OR_DEPARTMENT_OTHER): Payer: Self-pay

## 2021-04-22 MED ORDER — FENOFIBRATE 160 MG PO TABS
160.0000 mg | ORAL_TABLET | Freq: Every day | ORAL | 3 refills | Status: AC
Start: 2021-04-22 — End: ?
  Filled 2021-04-22: qty 90, 90d supply, fill #0

## 2021-05-08 DIAGNOSIS — H524 Presbyopia: Secondary | ICD-10-CM | POA: Diagnosis not present

## 2021-07-22 ENCOUNTER — Other Ambulatory Visit (HOSPITAL_BASED_OUTPATIENT_CLINIC_OR_DEPARTMENT_OTHER): Payer: Self-pay

## 2021-10-31 ENCOUNTER — Other Ambulatory Visit (HOSPITAL_COMMUNITY): Payer: Self-pay | Admitting: Internal Medicine

## 2021-11-05 ENCOUNTER — Other Ambulatory Visit (HOSPITAL_COMMUNITY): Payer: Self-pay | Admitting: Internal Medicine

## 2021-11-05 DIAGNOSIS — Z9889 Other specified postprocedural states: Secondary | ICD-10-CM

## 2021-11-12 ENCOUNTER — Ambulatory Visit (HOSPITAL_COMMUNITY)
Admission: RE | Admit: 2021-11-12 | Discharge: 2021-11-12 | Disposition: A | Discharge status: Home / Self Care | Payer: 59 | Source: Ambulatory Visit | Attending: Internal Medicine | Admitting: Internal Medicine

## 2021-11-12 ENCOUNTER — Encounter (HOSPITAL_COMMUNITY): Payer: Self-pay

## 2021-11-12 DIAGNOSIS — R922 Inconclusive mammogram: Secondary | ICD-10-CM | POA: Diagnosis not present

## 2021-11-12 DIAGNOSIS — Z9889 Other specified postprocedural states: Secondary | ICD-10-CM | POA: Insufficient documentation

## 2022-03-11 ENCOUNTER — Ambulatory Visit
Admission: EM | Admit: 2022-03-11 | Discharge: 2022-03-11 | Disposition: A | Discharge status: Home / Self Care | Payer: 59

## 2022-03-11 ENCOUNTER — Other Ambulatory Visit: Payer: Self-pay

## 2022-03-11 ENCOUNTER — Encounter: Payer: Self-pay | Admitting: Emergency Medicine

## 2022-03-11 DIAGNOSIS — U071 COVID-19: Secondary | ICD-10-CM | POA: Diagnosis not present

## 2022-03-11 NOTE — ED Triage Notes (Signed)
Pt reports weakness, fatigue for last several days and reports tested positive for COVID on Sunday.

## 2022-03-11 NOTE — ED Provider Notes (Signed)
[facility]    CSN: [id number] Arrival date & time: 03/11/22  0805      History   Chief Complaint Chief Complaint  Patient presents with   Weakness    HPI Katie Ho is a 62 y.o. female.   Patient presenting today with 2 to 3-day history of weakness, fatigue, headache, sinus pain and pressure, congestion, sore throat.  Denies chest pain, shortness of breath, abdominal pain, nausea vomiting or diarrhea.  So far trying NyQuil, Tylenol with minimal relief.  States she tested positive for COVID 2 days ago at home.  No known chronic pulmonary disease.    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   Family history of breast cancer 06/23/2012   Hypertriglyceridemia 06/22/2012   Rosacea 06/22/2012   Osteopenia 06/22/2012   Stress incontinence 06/22/2012    Past Surgical History:  Procedure Laterality Date   ABDOMINAL HYSTERECTOMY     BREAST EXCISIONAL BIOPSY Left 2020   CESAREAN SECTION     RADIOACTIVE SEED GUIDED EXCISIONAL BREAST BIOPSY Left 06/23/2019   Procedure: LEFT BREAST RADIOACTIVE SEED GUIDED EXCISIONAL BREAST BIOPSY;  Surgeon: Emelia Loron, MD;  Location: [facility];  Service: General;  Laterality: Left;    OB History     Gravida  5   Para  4   Term      Preterm      AB  1   Living  3      SAB  1   IAB      Ectopic      Multiple      Live Births               Home Medications    Prior to Admission medications   Medication Sig Start Date End Date Taking? Authorizing Provider  Cholecalciferol (VITAMIN D3) 125 MCG (5000 UT) CAPS  Take 2 capsules by mouth daily.    [provider]  COVID-19 At Home Antigen Test [facility] COVID-19 HOME TEST) KIT Use as directed. 12/04/20   Gwenlyn Fudge, RPH  fenofibrate 160 MG tablet TAKE 1 TABLET BY MOUTH ONCE DAILY 03/27/14   Constance Goltz, Harrington Challenger, MD  fenofibrate 160 MG tablet TAKE 1 TABLET BY MOUTH ONCE DAILY 04/09/20 04/15/21  Tisovec, Adelfa Koh, MD  fenofibrate 160 MG tablet TAKE 1 TABLET BY MOUTH ONCE DAILY 04/22/21     sertraline (ZOLOFT) 50 MG tablet Take 1 tablet by mouth once daily 10/22/20       Family History Family History  Problem Relation Age of Onset   Heart disease Father    Cancer Maternal Aunt 40       breast   Mental illness Maternal Grandmother    Cancer Maternal Grandfather        lung   Cancer Paternal Grandmother 86       breast    Social History Social History   Tobacco Use   Smoking status: Never   Smokeless tobacco: Never  Substance Use Topics   Alcohol use: No   Drug use: No     Allergies   Patient has no known allergies.  Review of Systems Review of Systems Per HPI  Physical Exam Triage Vital Signs ED Triage Vitals [03/11/22 0820]  Enc Vitals Group     BP (!) 146/75     Pulse Rate (!) 122     Resp 20     Temp 99 F (37.2 C)     Temp Source Oral     SpO2 98 %     Weight      Height      Head Circumference      Peak Flow      Pain Score 6     Pain Loc      Pain Edu?      Excl. in [place]?    No data found.  Updated Vital Signs BP (!) 146/75 (BP Location: Right Arm)   Pulse (!) 122   Temp 99 F (37.2 C) (Oral)   Resp 20   SpO2 98%   Visual Acuity Right Eye Distance:   Left Eye Distance:   Bilateral Distance:    Right Eye Near:   Left Eye Near:    Bilateral Near:     Physical Exam Vitals and nursing note reviewed.  Constitutional:      Appearance: Normal appearance.  HENT:     Head: Atraumatic.     Right Ear: Tympanic membrane and external ear normal.     Left Ear: Tympanic membrane and external  ear normal.     Nose: Rhinorrhea present.     Mouth/Throat:     Mouth: Mucous membranes are moist.     Pharynx: Posterior oropharyngeal erythema present.  Eyes:     Extraocular Movements: Extraocular movements intact.     Conjunctiva/sclera: Conjunctivae normal.  Cardiovascular:     Rate and Rhythm: Normal rate and regular rhythm.     Heart sounds: Normal heart sounds.  Pulmonary:     Effort: Pulmonary effort is normal.     Breath sounds: Normal breath sounds. No wheezing or rales.  Musculoskeletal:        General: Normal range of motion.     Cervical back: Normal range of motion and neck supple.  Skin:    General: Skin is warm and dry.  Neurological:     Mental Status: She is alert and oriented to person, place, and time.  Psychiatric:        Mood and Affect: Mood normal.        Thought Content: Thought content normal.      UC Treatments / Results  Labs (all labs ordered are listed, but only abnormal results are displayed) Labs Reviewed - No data to display  EKG   Radiology No results found.  Procedures Procedures (including critical care time)  Medications Ordered in UC Medications - No data to display  Initial Impression / Assessment and Plan / UC Course  I have reviewed the triage vital signs and the nursing notes.  Pertinent labs & imaging results that were available during my care of the patient were reviewed by me and considered in my medical decision making (see chart for details).     COVID-positive on home test and symptoms consistent with this.  Offered antiviral therapy as she would be a good candidate but she declines at this time.  Discussed supportive over-the-counter medications such as Flonase, sinus rinses, humidifiers, DayQuil, NyQuil.  Return for worsening symptoms.  Final Clinical Impressions(s) / UC Diagnoses   Final diagnoses:  [id number]     Discharge Instructions  Take over-the-counter medication such as DayQuil, NyQuil,  Flonase twice daily, sinus rinses with warm saline several times daily.  Humidifiers and drinking lots of fluids will also help clear the congestion faster.    ED Prescriptions   None    PDMP not reviewed this encounter.   Roosvelt Maser [place], [place] 03/11/22 [phone]

## 2022-03-11 NOTE — Discharge Instructions (Signed)
Take over-the-counter medication such as DayQuil, NyQuil, Flonase twice daily, sinus rinses with warm saline several times daily.  Humidifiers and drinking lots of fluids will also help clear the congestion faster.

## 2022-10-20 DIAGNOSIS — E781 Pure hyperglyceridemia: Secondary | ICD-10-CM | POA: Diagnosis not present

## 2022-10-20 DIAGNOSIS — E559 Vitamin D deficiency, unspecified: Secondary | ICD-10-CM | POA: Diagnosis not present

## 2022-10-20 DIAGNOSIS — R7301 Impaired fasting glucose: Secondary | ICD-10-CM | POA: Diagnosis not present

## 2022-10-20 DIAGNOSIS — R7989 Other specified abnormal findings of blood chemistry: Secondary | ICD-10-CM | POA: Diagnosis not present

## 2022-10-25 DIAGNOSIS — Z1212 Encounter for screening for malignant neoplasm of rectum: Secondary | ICD-10-CM | POA: Diagnosis not present

## 2022-10-27 DIAGNOSIS — K589 Irritable bowel syndrome without diarrhea: Secondary | ICD-10-CM | POA: Diagnosis not present

## 2022-10-27 DIAGNOSIS — E781 Pure hyperglyceridemia: Secondary | ICD-10-CM | POA: Diagnosis not present

## 2022-10-27 DIAGNOSIS — Z1331 Encounter for screening for depression: Secondary | ICD-10-CM | POA: Diagnosis not present

## 2022-10-27 DIAGNOSIS — L719 Rosacea, unspecified: Secondary | ICD-10-CM | POA: Diagnosis not present

## 2022-10-27 DIAGNOSIS — M8580 Other specified disorders of bone density and structure, unspecified site: Secondary | ICD-10-CM | POA: Diagnosis not present

## 2022-10-27 DIAGNOSIS — Z1339 Encounter for screening examination for other mental health and behavioral disorders: Secondary | ICD-10-CM | POA: Diagnosis not present

## 2022-10-27 DIAGNOSIS — Z0000 Encounter for general adult medical examination without abnormal findings: Secondary | ICD-10-CM | POA: Diagnosis not present

## 2022-10-27 DIAGNOSIS — Z8249 Family history of ischemic heart disease and other diseases of the circulatory system: Secondary | ICD-10-CM | POA: Diagnosis not present

## 2022-10-27 DIAGNOSIS — E559 Vitamin D deficiency, unspecified: Secondary | ICD-10-CM | POA: Diagnosis not present

## 2022-10-27 DIAGNOSIS — Z79890 Hormone replacement therapy (postmenopausal): Secondary | ICD-10-CM | POA: Diagnosis not present

## 2023-10-29 DIAGNOSIS — Z1212 Encounter for screening for malignant neoplasm of rectum: Secondary | ICD-10-CM | POA: Diagnosis not present

## 2023-10-30 DIAGNOSIS — Z1331 Encounter for screening for depression: Secondary | ICD-10-CM | POA: Diagnosis not present

## 2023-10-30 DIAGNOSIS — E781 Pure hyperglyceridemia: Secondary | ICD-10-CM | POA: Diagnosis not present

## 2023-10-30 DIAGNOSIS — L719 Rosacea, unspecified: Secondary | ICD-10-CM | POA: Diagnosis not present

## 2023-10-30 DIAGNOSIS — Z79890 Hormone replacement therapy (postmenopausal): Secondary | ICD-10-CM | POA: Diagnosis not present

## 2023-10-30 DIAGNOSIS — Z0000 Encounter for general adult medical examination without abnormal findings: Secondary | ICD-10-CM | POA: Diagnosis not present

## 2023-10-30 DIAGNOSIS — Z1339 Encounter for screening examination for other mental health and behavioral disorders: Secondary | ICD-10-CM | POA: Diagnosis not present

## 2023-10-30 DIAGNOSIS — Z8249 Family history of ischemic heart disease and other diseases of the circulatory system: Secondary | ICD-10-CM | POA: Diagnosis not present

## 2023-10-30 DIAGNOSIS — E559 Vitamin D deficiency, unspecified: Secondary | ICD-10-CM | POA: Diagnosis not present

## 2023-10-30 DIAGNOSIS — M8580 Other specified disorders of bone density and structure, unspecified site: Secondary | ICD-10-CM | POA: Diagnosis not present

## 2023-10-30 DIAGNOSIS — R82998 Other abnormal findings in urine: Secondary | ICD-10-CM | POA: Diagnosis not present

## 2023-10-30 DIAGNOSIS — K589 Irritable bowel syndrome without diarrhea: Secondary | ICD-10-CM | POA: Diagnosis not present

## 2023-12-31 DIAGNOSIS — D485 Neoplasm of uncertain behavior of skin: Secondary | ICD-10-CM | POA: Diagnosis not present

## 2024-01-19 DIAGNOSIS — L718 Other rosacea: Secondary | ICD-10-CM | POA: Diagnosis not present

## 2024-01-19 DIAGNOSIS — L570 Actinic keratosis: Secondary | ICD-10-CM | POA: Diagnosis not present
